# Patient Record
Sex: Female | Born: 2000 | Race: White | Hispanic: No | Marital: Single | Smoking: Former smoker
Health system: Southern US, Community
[De-identification: ages and names within clinical notes are randomized; demographics above are authoritative.]

---
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
## 2019-01-03 ENCOUNTER — Telehealth: Payer: Self-pay | Admitting: *Deleted

## 2019-01-03 DIAGNOSIS — F1994 Other psychoactive substance use, unspecified with psychoactive substance-induced mood disorder: Secondary | ICD-10-CM

## 2019-01-03 DIAGNOSIS — F1910 Other psychoactive substance abuse, uncomplicated: Secondary | ICD-10-CM

## 2019-01-03 MED ORDER — ACAMPROSATE CALCIUM 333 MG PO TBEC: 666.0000 mg | DELAYED_RELEASE_TABLET | Freq: Three times a day (TID) | ORAL | 11 refills | Status: DC

## 2019-01-03 NOTE — Telephone Encounter (Signed)
Pt left vm wanting your help with her alcohol use.  She states "i'm only 17 but it is really getting out of control".  Would you like for her to make an appointment with you first?

## 2019-01-03 NOTE — Telephone Encounter (Signed)
Tried calling pt back but her vm is full.  Will try again Monday morning.

## 2019-01-03 NOTE — Telephone Encounter (Signed)
Yes, we can do that, there are some medications that I would like her to take to decrease her urges, adding acamprosate, I am also going to do a referral for substance abuse treatment, and the office visit we can discuss Antabuse.

## 2019-03-13 ENCOUNTER — Ambulatory Visit: Payer: BC Managed Care – PPO | Admitting: Sports Medicine

## 2019-03-13 ENCOUNTER — Encounter: Payer: Self-pay | Admitting: Sports Medicine

## 2019-03-13 ENCOUNTER — Other Ambulatory Visit: Payer: Self-pay

## 2019-03-13 DIAGNOSIS — F329 Major depressive disorder, single episode, unspecified: Secondary | ICD-10-CM | POA: Diagnosis not present

## 2019-03-13 DIAGNOSIS — F419 Anxiety disorder, unspecified: Secondary | ICD-10-CM | POA: Diagnosis not present

## 2019-03-13 DIAGNOSIS — F32A Depression, unspecified: Secondary | ICD-10-CM

## 2019-03-13 MED ORDER — FLUOXETINE HCL 10 MG PO CAPS: 10.0000 mg | ORAL_CAPSULE | Freq: Every day | ORAL | 3 refills | Status: DC
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
## 2019-03-20 ENCOUNTER — Other Ambulatory Visit: Payer: Self-pay

## 2019-03-20 ENCOUNTER — Encounter: Payer: Self-pay | Admitting: Sports Medicine

## 2019-03-20 ENCOUNTER — Ambulatory Visit: Payer: BC Managed Care – PPO | Admitting: Sports Medicine

## 2019-03-20 VITALS — BP 99/64 | HR 91 | Wt 113.0 lb

## 2019-03-20 DIAGNOSIS — F419 Anxiety disorder, unspecified: Secondary | ICD-10-CM | POA: Diagnosis not present

## 2019-03-20 DIAGNOSIS — F329 Major depressive disorder, single episode, unspecified: Secondary | ICD-10-CM | POA: Diagnosis not present

## 2019-03-20 DIAGNOSIS — F32A Depression, unspecified: Secondary | ICD-10-CM

## 2019-03-20 DIAGNOSIS — Z23 Encounter for immunization: Secondary | ICD-10-CM | POA: Diagnosis not present
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
## 2019-03-27 ENCOUNTER — Ambulatory Visit: Payer: BC Managed Care – PPO | Admitting: Sports Medicine

## 2019-03-27 ENCOUNTER — Other Ambulatory Visit: Payer: Self-pay

## 2019-03-27 ENCOUNTER — Encounter: Payer: Self-pay | Admitting: Sports Medicine

## 2019-03-27 DIAGNOSIS — F419 Anxiety disorder, unspecified: Secondary | ICD-10-CM

## 2019-03-27 DIAGNOSIS — F329 Major depressive disorder, single episode, unspecified: Secondary | ICD-10-CM

## 2019-03-27 DIAGNOSIS — F32A Depression, unspecified: Secondary | ICD-10-CM

## 2019-03-27 MED ORDER — FLUOXETINE HCL 20 MG PO CAPS: 20.0000 mg | ORAL_CAPSULE | Freq: Every day | ORAL | 3 refills | Status: DC
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
## 2019-04-10 ENCOUNTER — Ambulatory Visit: Payer: BC Managed Care – PPO | Admitting: Sports Medicine

## 2019-05-09 ENCOUNTER — Telehealth (HOSPITAL_COMMUNITY): Payer: Self-pay | Admitting: Psychiatry

## 2019-05-09 NOTE — Telephone Encounter (Signed)
D:  Placed call to patient to orient her to [facility] and provide her with a start date, but there was no answer and vm was full.  A:  Will attempt to call pt later.

## 2019-05-12 ENCOUNTER — Telehealth (HOSPITAL_COMMUNITY): Payer: Self-pay | Admitting: Psychiatry

## 2019-06-16 ENCOUNTER — Emergency Department (INDEPENDENT_AMBULATORY_CARE_PROVIDER_SITE_OTHER)
Admission: EM | Admit: 2019-06-16 | Discharge: 2019-06-16 | Disposition: A | Discharge status: Home / Self Care | Payer: BC Managed Care – PPO | Source: Home / Self Care

## 2019-06-16 ENCOUNTER — Other Ambulatory Visit: Payer: Self-pay

## 2019-06-16 ENCOUNTER — Encounter: Payer: Self-pay | Admitting: Emergency Medicine

## 2019-06-16 DIAGNOSIS — B349 Viral infection, unspecified: Secondary | ICD-10-CM

## 2019-06-16 DIAGNOSIS — R05 Cough: Secondary | ICD-10-CM | POA: Diagnosis not present

## 2019-06-16 DIAGNOSIS — R059 Cough, unspecified: Secondary | ICD-10-CM

## 2019-06-16 NOTE — ED Triage Notes (Signed)
Pt states she had cold sxs x4 days. She has also lost her sense of smell and wants a covid test.

## 2019-06-16 NOTE — Discharge Instructions (Addendum)
Your symptoms are certainly consistent with COVID-19.  We are running a test that should be back in 2 to 3 days.  We will contact you if it is positive.  You can check my chart to see when the results will be back.  In the meantime we want to boost your immune system with the following: Vitamin C 500 mg twice a day Vitamin D 125 mcg or 5000 international units daily Zinc 50 mg daily Listerine mouthwash several times a day.

## 2019-06-16 NOTE — ED Provider Notes (Signed)
[facility]    CSN: [id number] Arrival date & time: 06/16/19  1413      History   Chief Complaint Chief Complaint  Patient presents with  . loss of smell    HPI Sonya Villegas is a 18 y.o. female.   Sonya Villegas urgent care patient   Pt states she had cold sxs x4 days. She has also lost her sense of smell and wants a covid test.  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  She last worked 4 days ago and developed her symptoms of cough on Friday.  She then felt normal Saturday Sunday and today.  Nevertheless, she lost her sense of taste and smell on Saturday.       History reviewed. No pertinent past medical history.  Patient Active Problem List   Diagnosis Date Noted  . Injury of left index finger 11/11/2018  . Vaginal discharge 11/04/2018  . Contraception management 08/03/2017  . Polysubstance abuse (alcohol, marijuana) with resultant mood disorder 07/24/2017  . Anxiety and depression 04/04/2017  . Annual physical exam 12/04/2016  . Numbness and tingling of left leg 12/04/2016  . Lateral dislocation of left patella 12/14/2014    History reviewed. No pertinent surgical history.  OB History   No obstetric history on file.      Home Medications    Prior to Admission medications   Medication Sig Start Date End Date Taking? Authorizing Provider  etonogestrel (NEXPLANON) 68 MG IMPL implant 1 each by Subdermal route once. 08/08/17   [provider]  FLUoxetine (PROZAC) 20 MG capsule Take 1 capsule (20 mg total) by mouth daily. 03/27/19   Monica Becton, MD    Family History History reviewed. No pertinent family history.  Social History Social History   Tobacco Use  . Smoking status: Former Smoker    Types: E-cigarettes  . Smokeless tobacco: Never Used  . Tobacco comment: Pt vapes  Substance Use Topics  . Alcohol use: No    Alcohol/week: 0.0 standard drinks  . Drug use: Yes    Types: Marijuana    Comment: occassional      Allergies   Penicillins and Amoxicillin   Review of Systems Review of Systems   Physical Exam Triage Vital Signs ED Triage Vitals  Enc Vitals Group     BP 06/16/19 1434 109/85     Pulse Rate 06/16/19 1434 90     Resp --      Temp 06/16/19 1434 98.3 F (36.8 C)     Temp Source 06/16/19 1434 Oral     SpO2 06/16/19 1434 98 %     Weight 06/16/19 1435 115 lb (52.2 kg)     Height --      Head Circumference --      Peak Flow --      Pain Score 06/16/19 1435 0     Pain Loc --      Pain Edu? --      Excl. in GC? --    No data found.  Updated Vital Signs BP 109/85 (BP Location: Right Arm)   Pulse 90   Temp 98.3 F (36.8 C) (Oral)   Wt 52.2 kg   SpO2 98%   BMI 19.74 kg/m    Physical Exam Vitals and nursing note reviewed.  Constitutional:      Appearance: Normal appearance.  Eyes:     Conjunctiva/sclera: Conjunctivae normal.  Cardiovascular:     Rate and Rhythm: Normal rate.  Pulses: Normal pulses.  Pulmonary:     Effort: Pulmonary effort is normal.     Breath sounds: Normal breath sounds.  Musculoskeletal:        General: Normal range of motion.     Cervical back: Normal range of motion and neck supple.  Skin:    General: Skin is warm.  Neurological:     General: No focal deficit present.     Mental Status: She is alert and oriented to person, place, and time.  Psychiatric:        Mood and Affect: Mood normal.        Behavior: Behavior normal.      UC Treatments / Results  Labs (all labs ordered are listed, but only abnormal results are displayed) Labs Reviewed  NOVEL CORONAVIRUS, NAA    EKG   Radiology No results found.  Procedures Procedures (including critical care time)  Medications Ordered in UC Medications - No data to display  Initial Impression / Assessment and Plan / UC Course  I have reviewed the triage vital signs and the nursing notes.  Pertinent labs & imaging results that were available during my care of the  patient were reviewed by me and considered in my medical decision making (see chart for details).    Final Clinical Impressions(s) / UC Diagnoses   Final diagnoses:  Viral syndrome     Discharge Instructions     Your symptoms are certainly consistent with COVID-19.  We are running a test that should be back in 2 to 3 days.  We will contact you if it is positive.  You can check my chart to see when the results will be back.  In the meantime we want to boost your immune system with the following: Vitamin C 500 mg twice a day Vitamin D 125 mcg or 5000 international units daily Zinc 50 mg daily Listerine mouthwash several times a day.    ED Prescriptions    None     I have reviewed the PDMP during this encounter.   Robyn Haber, MD 06/16/19 1539

## 2019-06-18 ENCOUNTER — Telehealth: Payer: Self-pay | Admitting: Emergency Medicine

## 2019-06-18 LAB — NOVEL CORONAVIRUS, NAA: SARS-CoV-2, NAA: DETECTED — AB

## 2019-06-18 NOTE — Telephone Encounter (Signed)
Your test for COVID-19 was positive, meaning that you were infected with the novel coronavirus and could give the germ to others.  Please continue isolation at home for at least 10 days since the start of your symptoms. If you do not have symptoms, please isolate at home for 10 days from the day you were tested. Once you complete your 10 day quarantine, you may return to normal activities as long as you've not had a fever for over 24 hours(without taking fever reducing medicine) and your symptoms are improving. Please continue good preventive care measures, including:  frequent hand-washing, avoid touching your face, cover coughs/sneezes, stay out of crowds and keep a 6 foot distance from others.  Go to the nearest hospital emergency room if fever/cough/breathlessness are severe or illness seems like a threat to life.  Patient contacted by phone and made aware of    results. Pt verbalized understanding. She had no questions.   

## 2019-09-26 IMAGING — DX LEFT INDEX FINGER 2+V
3 series · 3 of 3 positions shown · non-contrast
Comparison: None.

CLINICAL DATA: Pain

EXAM:
LEFT INDEX FINGER 2+V

[finger ap]
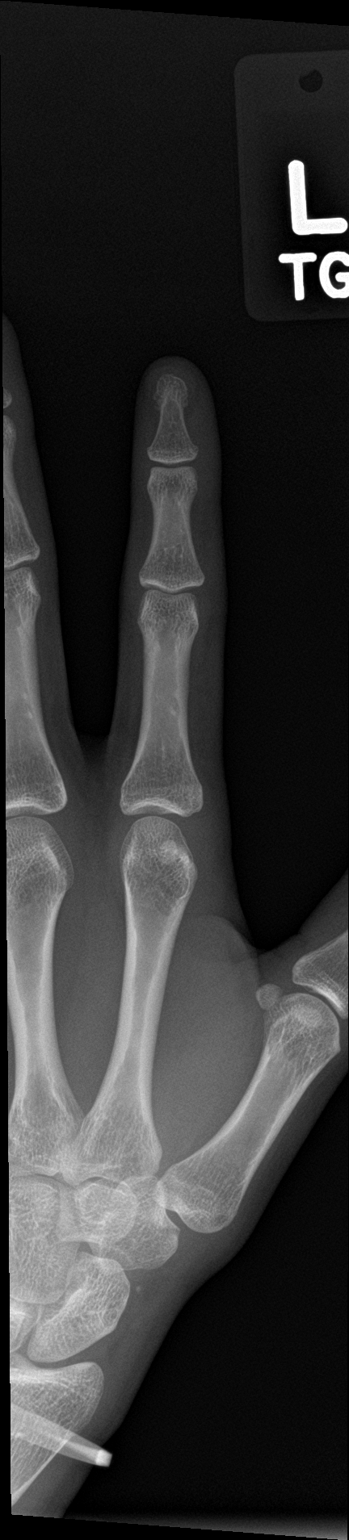

[finger obl]
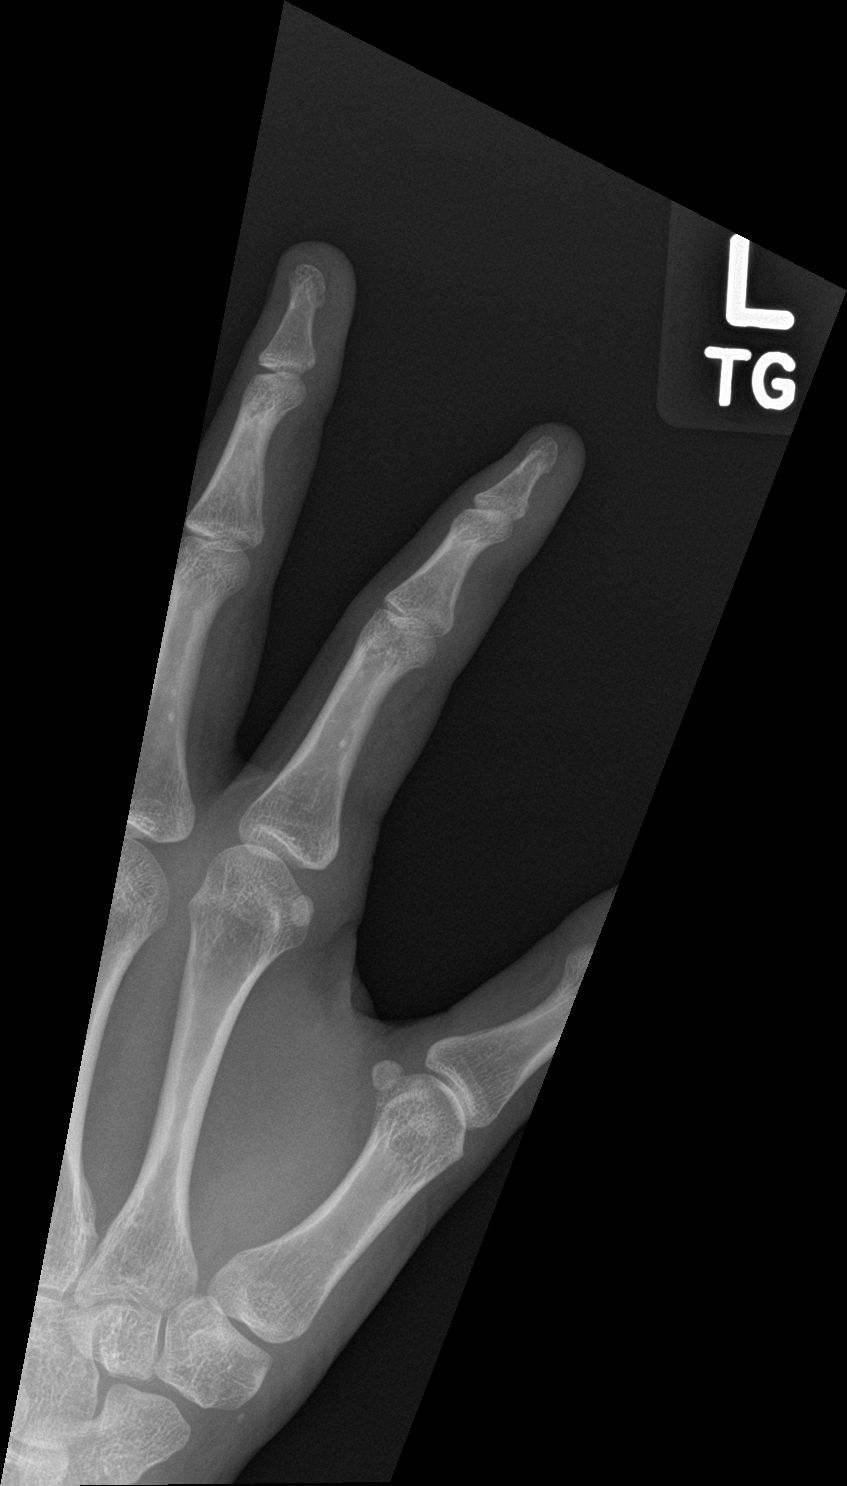

[finger lat]
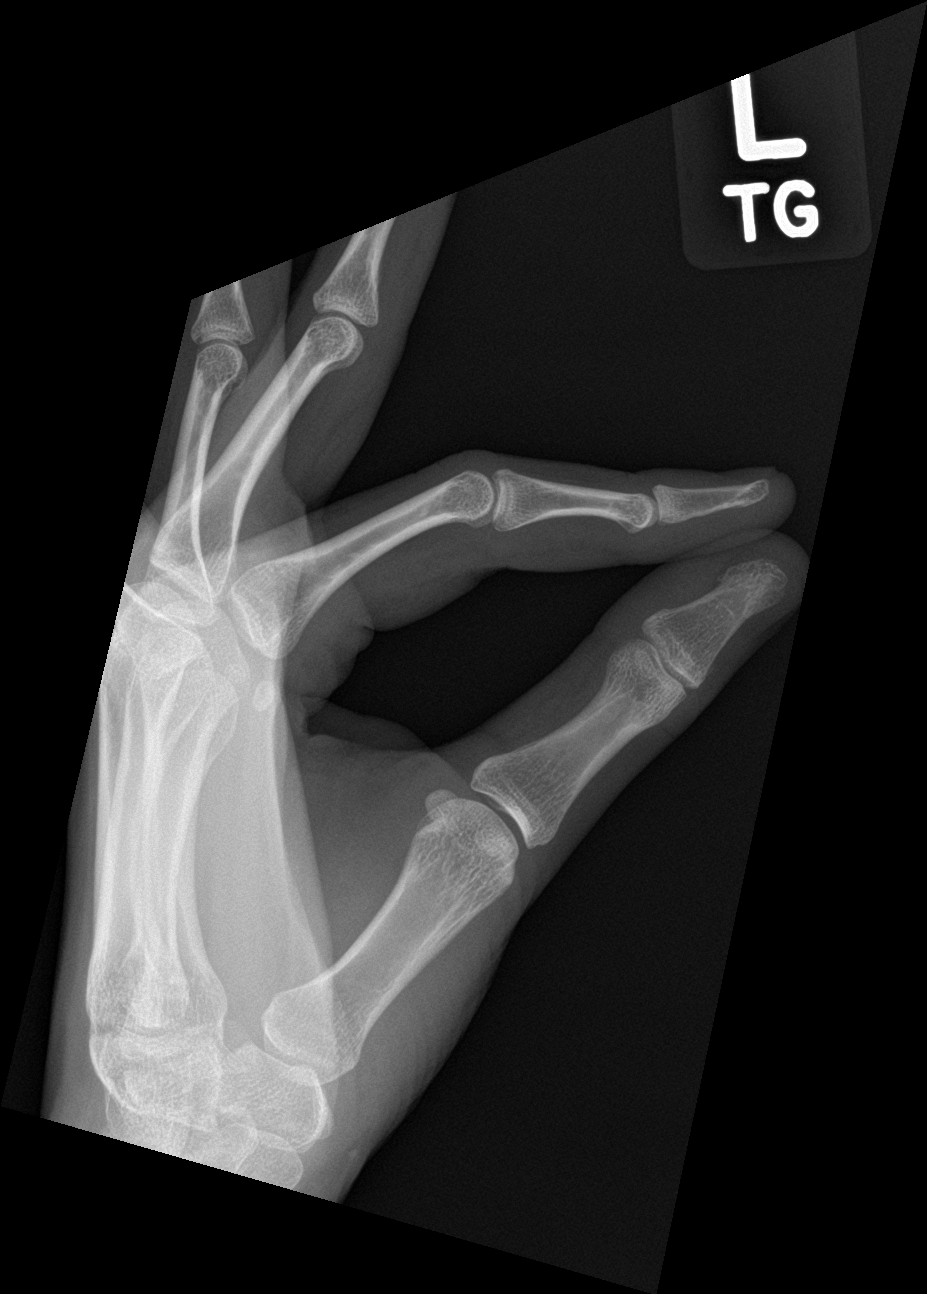

[3 of 3 positions shown; findings below may reference images not displayed]

FINDINGS: No acute fracture. No dislocation.  Unremarkable soft tissues.
IMPRESSION: No acute bony pathology.

## 2019-12-03 ENCOUNTER — Encounter: Payer: Self-pay | Admitting: Family Medicine

## 2019-12-03 ENCOUNTER — Telehealth (INDEPENDENT_AMBULATORY_CARE_PROVIDER_SITE_OTHER): Payer: BC Managed Care – PPO | Admitting: Family Medicine

## 2019-12-03 DIAGNOSIS — J029 Acute pharyngitis, unspecified: Secondary | ICD-10-CM | POA: Insufficient documentation

## 2019-12-03 MED ORDER — AZITHROMYCIN 250 MG PO TABS: ORAL_TABLET | ORAL | 0 refills | Status: DC

## 2019-12-03 NOTE — Progress Notes (Signed)
Thinks lymph nodes are swollen. Pain up in to her ears. Her nose and chest are congested.   Had strep often as a child. Feels similar.   Took Nyquil and Advil

## 2019-12-03 NOTE — Progress Notes (Signed)
Sonya Villegas - 19 y.o. female MRN [id number]  Date of birth: 2001-04-27   This visit type was conducted due to national recommendations for restrictions regarding the COVID-19 Pandemic (e.g. social distancing).  This format is felt to be most appropriate for this patient at this time.  All issues noted in this document were discussed and addressed.  No physical exam was performed (except for noted visual exam findings with Video Visits).  I discussed the limitations of evaluation and management by telemedicine and the availability of in person appointments. The patient expressed understanding and agreed to proceed.  I connected with@ on 12/03/19 at  2:40 PM EDT by a video enabled telemedicine application and verified that I am speaking with the correct person using two identifiers.  Present at visit: Sonya Coombe, DO Sonya Villegas   Patient Location: Home [address] [place] [address]   Provider location:   [facility]  No chief complaint on file.   HPI  Sonya Villegas is a 19 y.o. female who presents via audio/video conferencing for a telehealth visit today.  She has complaint of sore throat, subjective fever and swollen lymph nodes.  Has tonsil/throat swelling.  She has had some mild congestion as well.  She denies body aches, wheezing, shortness of breath, palpitations, headache, nausea, vomiting, diarrhea.  She had lab proven COVID In December.     ROS:  A comprehensive ROS was completed and negative except as noted per HPI  No past medical history on file.  No past surgical history on file.  No family history on file.  Social History   Socioeconomic History  . Marital status: Single    Spouse name: Not on file  . Number of children: Not on file  . Years of education: Not on file  . Highest education level: Not on file  Occupational History  . Not on file  Tobacco Use  . Smoking status: Former Smoker    Types: E-cigarettes  . Smokeless tobacco: Never Used  . Tobacco  comment: Pt vapes  Substance and Sexual Activity  . Alcohol use: No    Alcohol/week: 0.0 standard drinks  . Drug use: Yes    Types: Marijuana    Comment: occassional  . Sexual activity: Not on file  Other Topics Concern  . Not on file  Social History Narrative  . Not on file   Social Determinants of Health   Financial Resource Strain:   . Difficulty of Paying Living Expenses:   Food Insecurity:   . Worried About Programme researcher, broadcasting/film/video in the Last Year:   . Barista in the Last Year:   Transportation Needs:   . Freight forwarder (Medical):   Marland Kitchen Lack of Transportation (Non-Medical):   Physical Activity:   . Days of Exercise per Week:   . Minutes of Exercise per Session:   Stress:   . Feeling of Stress :   Social Connections:   . Frequency of Communication with Friends and Family:   . Frequency of Social Gatherings with Friends and Family:   . Attends Religious Services:   . Active Member of Clubs or Organizations:   . Attends Banker Meetings:   Marland Kitchen Marital Status:   Intimate Partner Violence:   . Fear of Current or Ex-Partner:   . Emotionally Abused:   Marland Kitchen Physically Abused:   . Sexually Abused:      Current Outpatient Medications:  .  etonogestrel (NEXPLANON) 68 MG  IMPL implant, 1 each by Subdermal route once., Disp: , Rfl:  .  azithromycin (ZITHROMAX) 250 MG tablet, Take 2 tabs on day 1 followed by 1 tab on days 2-5, Disp: 6 tablet, Rfl: 0 .  FLUoxetine (PROZAC) 20 MG capsule, Take 1 capsule (20 mg total) by mouth daily. (Patient not taking: Reported on 12/03/2019), Disp: 30 capsule, Rfl: 3  EXAM:  VITALS per patient if applicable: Wt 105 lb (47.6 kg)   BMI 18.02 kg/m   GENERAL: alert, oriented, appears well and in no acute distress  HEENT: atraumatic, conjunttiva clear, no obvious abnormalities on inspection of external nose and ears  NECK: normal movements of the head and neck  LUNGS: on inspection no signs of respiratory distress,  breathing rate appears normal, no obvious gross SOB, gasping or wheezing  CV: no obvious cyanosis  MS: moves all visible extremities without noticeable abnormality  PSYCH/NEURO: pleasant and cooperative, no obvious depression or anxiety, speech and thought processing grossly intact  ASSESSMENT AND PLAN:  Discussed the following assessment and plan:  Pharyngitis Similar to previous strep episodes.  Centor score of 3 Will cover empirically for strep pharyngitis with azithromycin as she is PCN allergic.   She has had COVID previously but discussed with her that after 3-4 months there is no guarantee that natural immunity is still protective.   We discussed if she has worsening symptoms I would recommend COVID testing.   Recommend warm salt water gargles for comfort and stay well hydrated.   30 minutes spent including pre visit preparation, review of prior notes and labs, encounter with patient via video visit and same day documentation.    I discussed the assessment and treatment plan with the patient. The patient was provided an opportunity to ask questions and all were answered. The patient agreed with the plan and demonstrated an understanding of the instructions.   The patient was advised to call back or seek an in-person evaluation if the symptoms worsen or if the condition fails to improve as anticipated.    Luetta Nutting, DO

## 2019-12-03 NOTE — Assessment & Plan Note (Addendum)
Similar to previous strep episodes.  Centor score of 3 Will cover empirically for strep pharyngitis with azithromycin as she is PCN allergic.   She has had COVID previously but discussed with her that after 3-4 months there is no guarantee that natural immunity is still protective.   We discussed if she has worsening symptoms I would recommend COVID testing.   Recommend warm salt water gargles for comfort and stay well hydrated.

## 2019-12-04 ENCOUNTER — Telehealth: Payer: Self-pay | Admitting: Sports Medicine

## 2019-12-04 NOTE — Telephone Encounter (Signed)
Discussed with her that she would need to sign up through mychart to receive electronically. Sent link yesterday.   Otherwise she can stop by and pick up.

## 2019-12-04 NOTE — Telephone Encounter (Signed)
Patient never received a work Physicist, medical through e-mail excusing her. 6/9- to however long she should be out for strep throat. Email to [email or web address]

## 2019-12-05 ENCOUNTER — Ambulatory Visit (INDEPENDENT_AMBULATORY_CARE_PROVIDER_SITE_OTHER): Payer: BC Managed Care – PPO | Admitting: Nurse Practitioner

## 2019-12-05 DIAGNOSIS — Z5329 Procedure and treatment not carried out because of patient's decision for other reasons: Secondary | ICD-10-CM

## 2019-12-05 NOTE — Telephone Encounter (Signed)
Patient advised.

## 2019-12-16 ENCOUNTER — Encounter (HOSPITAL_COMMUNITY): Payer: Self-pay | Admitting: Psychiatry

## 2019-12-16 ENCOUNTER — Telehealth (INDEPENDENT_AMBULATORY_CARE_PROVIDER_SITE_OTHER): Payer: BC Managed Care – PPO | Admitting: Psychiatry

## 2019-12-16 DIAGNOSIS — F0630 Mood disorder due to known physiological condition, unspecified: Secondary | ICD-10-CM | POA: Diagnosis not present

## 2019-12-16 DIAGNOSIS — F411 Generalized anxiety disorder: Secondary | ICD-10-CM | POA: Diagnosis not present

## 2019-12-16 DIAGNOSIS — F4010 Social phobia, unspecified: Secondary | ICD-10-CM | POA: Diagnosis not present

## 2019-12-16 MED ORDER — LAMOTRIGINE 25 MG PO TABS: 25.0000 mg | ORAL_TABLET | Freq: Every day | ORAL | 0 refills | Status: DC

## 2019-12-16 MED ORDER — BUSPIRONE HCL 7.5 MG PO TABS: 7.5000 mg | ORAL_TABLET | Freq: Every day | ORAL | 0 refills | Status: DC
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
## 2019-12-24 ENCOUNTER — Ambulatory Visit (INDEPENDENT_AMBULATORY_CARE_PROVIDER_SITE_OTHER): Payer: BC Managed Care – PPO | Admitting: Sports Medicine

## 2019-12-24 ENCOUNTER — Encounter: Payer: Self-pay | Admitting: Sports Medicine

## 2019-12-24 ENCOUNTER — Other Ambulatory Visit: Payer: Self-pay

## 2019-12-24 DIAGNOSIS — F319 Bipolar disorder, unspecified: Secondary | ICD-10-CM | POA: Diagnosis not present

## 2019-12-24 DIAGNOSIS — S060X1A Concussion with loss of consciousness of 30 minutes or less, initial encounter: Secondary | ICD-10-CM

## 2019-12-24 DIAGNOSIS — S060XAA Concussion with loss of consciousness status unknown, initial encounter: Secondary | ICD-10-CM | POA: Insufficient documentation

## 2019-12-24 NOTE — Progress Notes (Signed)
° ° °  Procedures performed today:    None.  Independent interpretation of notes and tests performed by another provider:   I reviewed notes from psychiatry downstairs.  I also reviewed notes from her hospitalization at [facility].  I have also reviewed labs done in the hospital.  Brief History, Exam, Impression, and Recommendations:    Concussion This is a pleasant 19 year old female, she is currently fighting bipolar disorder, she has lost a significant amount of weight. She was at a party, did not eat, had some alcohol, and smokes some marijuana, it sounds like she became orthostatic, fell and hit her head.  She denied any prodromal palpitations or chest pain, she also denies any postevent tongue biting or incontinence, no seizure activity was seen. She was conscious almost immediately but had hit her head on the way down.  She has since had difficulties focusing, headaches, initially some nausea, bright lights and screens tended to bother her. Symptoms have improved considerably, she feels safe to drive and that her focus has improved, but she still has mild headaches and difficulty with bright screens. 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 Neurologic exam is otherwise unremarkable. I think she should practice physical and cognitive rest with only 1 to 2 hours of screen time per day, and I would like to see her back in about 2 weeks to repeat testing.  Bipolar 1 disorder (HCC) Currently under the care of Dr. Gilmore Laroche, she is on BuSpar and Lamictal. Currently in manic phase, she has great insight regarding not acting on desires to do risky behaviors. No current suicidal/homicidal ideation.    ___________________________________________ Ihor Austin. Benjamin Stain, M.D., ABFM., CAQSM. Primary Care and Sports Medicine [place]  [facility]  Adjunct Instructor of Family Medicine  [employer]

## 2019-12-24 NOTE — Assessment & Plan Note (Signed)
Currently under the care of Dr. Gilmore Laroche, she is on BuSpar and Lamictal. Currently in manic phase, she has great insight regarding not acting on desires to do risky behaviors. No current suicidal/homicidal ideation.

## 2019-12-24 NOTE — Assessment & Plan Note (Signed)
This is a pleasant 19 year old female, she is currently fighting bipolar disorder, she has lost a significant amount of weight. She was at a party, did not eat, had some alcohol, and smokes some marijuana, it sounds like she became orthostatic, fell and hit her head.  She denied any prodromal palpitations or chest pain, she also denies any postevent tongue biting or incontinence, no seizure activity was seen. She was conscious almost immediately but had hit her head on the way down.  She has since had difficulties focusing, headaches, initially some nausea, bright lights and screens tended to bother her. Symptoms have improved considerably, she feels safe to drive and that her focus has improved, but she still has mild headaches and difficulty with bright screens. 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 Neurologic exam is otherwise unremarkable. I think she should practice physical and cognitive rest with only 1 to 2 hours of screen time per day, and I would like to see her back in about 2 weeks to repeat testing.

## 2019-12-24 NOTE — Patient Instructions (Signed)
Concussion, Adult  A concussion is a brain injury from a hard, direct hit (trauma) to the head or body. This direct hit causes the brain to shake quickly back and forth inside the skull. This can damage brain cells and cause chemical changes in the brain. A concussion may also be known as a mild traumatic brain injury (TBI). Concussions are usually not life-threatening, but the effects of a concussion can be serious. If you have a concussion, you should be very careful to avoid having a second concussion. What are the causes? This condition is caused by:  A direct hit to your head, such as: ? Running into another player during a game. ? Being hit in a fight. ? Hitting your head on a hard surface.  Sudden movement of your body that causes your brain to move back and forth inside the skull, such as in a car crash. What are the signs or symptoms? The signs of a concussion can be hard to notice. Early on, they may be missed by you, family members, and health care providers. You may look fine on the outside but may act or feel differently. Symptoms are usually temporary and most often improve in 7-10 days. Some symptoms appear right away, but other symptoms may not show up for hours or days. If your symptoms last longer than normal, you may have post-concussion syndrome. Every head injury is different. Physical symptoms  Headaches. This can include a feeling of pressure in the head or migraine-like symptoms.  Tiredness (fatigue).  Dizziness.  Problems with coordination or balance.  Vision or hearing problems.  Sensitivity to light or noise.  Nausea or vomiting.  Changes in eating or sleeping patterns.  Numbness or tingling.  Seizure. Mental and emotional symptoms  Memory problems.  Trouble concentrating, organizing, or making decisions.  Slowness in thinking, acting or reacting, speaking, or reading.  Irritability or mood changes.  Anxiety or depression. How is this  diagnosed? This condition is diagnosed based on:  Your symptoms.  A description of your injury. You may also have tests, including:  Imaging tests, such as a CT scan or MRI.  Neuropsychological tests. These measure your thinking, understanding, learning, and remembering abilities. How is this treated? Treatment for this condition includes:  Stopping sports or activity if you are injured. If you hit your head or show signs of concussion: ? Do not return to sports or activities the same day. ? Get checked by a health care provider before you return to your activities.  Physical and mental rest and careful observation, usually at home. Gradually return to your normal activities.  Medicines to help with symptoms such as headaches, nausea, or difficulty sleeping. ? Avoid taking opioid pain medicine while recovering from a concussion.  Avoiding alcohol and drugs. These may slow your recovery and can put you at risk of further injury.  Referral to a concussion clinic or rehabilitation center. Recovery from a concussion can take time. How fast you recover depends on many factors. Return to activities only when:  Your symptoms are completely gone.  Your health care provider says that it is safe. Follow these instructions at home: Activity  Limit activities that require a lot of thought or concentration, such as: ? Doing homework or job-related work. ? Watching TV. ? Working on the computer or phone. ? Playing memory games and puzzles.  Rest. Rest helps your brain heal. Make sure you: ? Get plenty of sleep. Most adults should get 7-9 hours of sleep   each night. ? Rest during the day. Take naps or rest breaks when you feel tired.  Avoid physical activity like exercise until your health care provider says it is safe. Stop any activity that worsens symptoms.  Do not do high-risk activities that could cause a second concussion, such as riding a bike or playing sports.  Ask your  health care provider when you can return to your normal activities, such as school, work, athletics, and driving. Your ability to react may be slower after a brain injury. Never do these activities if you are dizzy. Your health care provider will likely give you a plan for gradually returning to activities. General instructions   Take over-the-counter and prescription medicines only as told by your health care provider. Some medicines, such as blood thinners (anticoagulants) and aspirin, may increase the risk for complications, such as bleeding.  Do not drink alcohol until your health care provider says you can.  Watch your symptoms and tell others around you to do the same. Complications sometimes occur after a concussion. Older adults with a brain injury may have a higher risk of serious complications.  Tell your work manager, teachers, school nurse, school counselor, coach, or athletic trainer about your injury, symptoms, and restrictions.  Keep all follow-up visits as told by your health care provider. This is important. How is this prevented? Avoiding another brain injury is very important. In rare cases, another injury can lead to permanent brain damage, brain swelling, or death. The risk of this is greatest during the first 7-10 days after a head injury. Avoid injuries by:  Stopping activities that could lead to a second concussion, such as contact or recreational sports, until your health care provider says it is okay.  Taking these actions once you have returned to sports or activities: ? Avoiding plays or moves that can cause you to crash into another person. This is how most concussions occur. ? Following the rules and being respectful of other players. Do not engage in violent or illegal plays.  Getting regular exercise that includes strength and balance training.  Wearing a properly fitting helmet during sports, biking, or other activities. Helmets can help protect you from  serious skull and brain injuries, but they do not protect you from a concussion. Even when wearing a helmet, you should avoid being hit in the head. Contact a health care provider if:  Your symptoms get worse or they do not improve.  You have new symptoms.  You have another injury. Get help right away if:  You have severe or worsening headaches.  You have weakness or numbness in any part of your body.  You are confused.  Your coordination gets worse.  You vomit repeatedly.  You are sleepier than normal.  Your speech is slurred.  You cannot recognize people or places.  You have a seizure.  It is difficult to wake you up.  You have unusual behavior changes.  You have changes in your vision.  You lose consciousness. Summary  A concussion is a brain injury that results from a hard, direct hit (trauma) to your head or body.  You may have imaging tests and neuropsychological tests to diagnose a concussion.  Treatment for this condition includes physical and mental rest and careful observation.  Ask your health care provider when you can return to your normal activities, such as school, work, athletics, and driving.  Get help right away if you have a severe headache, weakness on one side of the   body, seizures, behavior changes, changes in vision, or if you are confused or sleepier than normal. This information is not intended to replace advice given to you by your health care provider. Make sure you discuss any questions you have with your health care provider. Document Revised: 01/31/2018 Document Reviewed: 01/31/2018 Elsevier Patient Education  2020 Elsevier Inc.  

## 2020-01-05 ENCOUNTER — Encounter (HOSPITAL_COMMUNITY): Payer: Self-pay | Admitting: Psychiatry

## 2020-01-05 ENCOUNTER — Telehealth (INDEPENDENT_AMBULATORY_CARE_PROVIDER_SITE_OTHER): Payer: BC Managed Care – PPO | Admitting: Psychiatry

## 2020-01-05 DIAGNOSIS — F0630 Mood disorder due to known physiological condition, unspecified: Secondary | ICD-10-CM

## 2020-01-05 DIAGNOSIS — F411 Generalized anxiety disorder: Secondary | ICD-10-CM | POA: Diagnosis not present

## 2020-01-05 DIAGNOSIS — F4010 Social phobia, unspecified: Secondary | ICD-10-CM | POA: Diagnosis not present

## 2020-01-05 MED ORDER — BUSPIRONE HCL 7.5 MG PO TABS: 7.5000 mg | ORAL_TABLET | Freq: Every day | ORAL | 0 refills | Status: AC

## 2020-01-05 MED ORDER — LAMOTRIGINE 100 MG PO TABS: 100.0000 mg | ORAL_TABLET | Freq: Every day | ORAL | 0 refills | Status: DC
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
## 2020-01-07 ENCOUNTER — Encounter: Payer: Self-pay | Admitting: Sports Medicine

## 2020-01-07 ENCOUNTER — Ambulatory Visit (INDEPENDENT_AMBULATORY_CARE_PROVIDER_SITE_OTHER): Payer: BC Managed Care – PPO | Admitting: Sports Medicine

## 2020-01-07 DIAGNOSIS — S060X1D Concussion with loss of consciousness of 30 minutes or less, subsequent encounter: Secondary | ICD-10-CM

## 2020-01-07 DIAGNOSIS — I951 Orthostatic hypotension: Secondary | ICD-10-CM | POA: Diagnosis not present

## 2020-01-07 MED ORDER — FLUDROCORTISONE ACETATE 0.1 MG PO TABS: 0.1000 mg | ORAL_TABLET | Freq: Every day | ORAL | 0 refills | Status: DC

## 2020-01-07 NOTE — Progress Notes (Signed)
    Procedures performed today:    None.  Independent interpretation of notes and tests performed by another provider:   None.  Brief History, Exam, Impression, and Recommendations:    Concussion Sonya Villegas returns, I think her concussive symptoms are resolved, she is somewhat orthostatic.  Orthostatic hypotension Sonya Villegas has also had significant episodes of dizziness and presyncope on standing, she does have abnormal orthostatic vital signs today. She feels as though she is drinking a sufficient amount of fluids, because of this we are going to check some labs, and I am going to start her on a low-dose of fludrocortisone. She also understands that her marijuana use is likely resulting in decrease in blood pressure as well. I'd like to see her back in 1 to 2 weeks to reevaluate. If insufficient improvement we will likely have to run some normal saline IV.    ___________________________________________ Ihor Austin. Benjamin Stain, M.D., ABFM., CAQSM. Primary Care and Sports Medicine [facility]  Adjunct Instructor of Family Medicine  [employer]

## 2020-01-07 NOTE — Assessment & Plan Note (Addendum)
Sonya Villegas has also had significant episodes of dizziness and presyncope on standing, she does have abnormal orthostatic vital signs today. She feels as though she is drinking a sufficient amount of fluids, because of this we are going to check some labs, and I am going to start her on a low-dose of fludrocortisone. She also understands that her marijuana use is likely resulting in decrease in blood pressure as well. I'd like to see her back in 1 to 2 weeks to reevaluate. If insufficient improvement we will likely have to run some normal saline IV.

## 2020-01-07 NOTE — Assessment & Plan Note (Signed)
Sonya Villegas returns, I think her concussive symptoms are resolved, she is somewhat orthostatic.

## 2020-01-21 ENCOUNTER — Ambulatory Visit (INDEPENDENT_AMBULATORY_CARE_PROVIDER_SITE_OTHER): Payer: BC Managed Care – PPO | Admitting: Sports Medicine

## 2020-01-21 ENCOUNTER — Encounter: Payer: Self-pay | Admitting: Sports Medicine

## 2020-01-21 DIAGNOSIS — I951 Orthostatic hypotension: Secondary | ICD-10-CM | POA: Diagnosis not present

## 2020-01-21 DIAGNOSIS — R634 Abnormal weight loss: Secondary | ICD-10-CM | POA: Diagnosis not present

## 2020-01-21 MED ORDER — MIRTAZAPINE 15 MG PO TABS: 15.0000 mg | ORAL_TABLET | Freq: Every day | ORAL | 3 refills | Status: DC
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
## 2020-01-21 NOTE — Assessment & Plan Note (Signed)
Dizziness, presyncope has now resolved with low-dose fludrocortisone. Continue this for now, I am going to help her gain some weight per her request so my suspicion is we will eventually be able to discontinue this medication.

## 2020-02-10 ENCOUNTER — Telehealth (HOSPITAL_COMMUNITY): Payer: BC Managed Care – PPO | Admitting: Psychiatry

## 2020-02-18 ENCOUNTER — Ambulatory Visit: Payer: BC Managed Care – PPO | Admitting: Sports Medicine

## 2020-04-05 ENCOUNTER — Encounter: Payer: Self-pay | Admitting: Osteopathic Medicine

## 2020-04-05 ENCOUNTER — Ambulatory Visit: Payer: BC Managed Care – PPO | Admitting: Osteopathic Medicine

## 2020-04-05 ENCOUNTER — Other Ambulatory Visit: Payer: Self-pay

## 2020-04-05 VITALS — BP 99/66 | HR 74 | Temp 98.0°F | Wt 115.0 lb

## 2020-04-05 DIAGNOSIS — Z3046 Encounter for surveillance of implantable subdermal contraceptive: Secondary | ICD-10-CM

## 2020-04-05 MED ORDER — NORETHIN ACE-ETH ESTRAD-FE 1-20 MG-MCG PO TABS
1.0000 | ORAL_TABLET | Freq: Every day | ORAL | 4 refills | Status: DC
Start: 2020-04-05 — End: 2021-08-08

## 2020-04-05 NOTE — Progress Notes (Signed)
Nexplanon Removal Procedure Note PRE-OP DIAGNOSIS: Nexplanon, desire for change of contraception  POST-OP DIAGNOSIS: Same  PROCEDURE: Nexplanon Removal  Performing Physician: Lyn Hollingshead PROCEDURE:  Anesthesia: 2% Lidocaine w/ epinephrine 5 ml  Procedure: Consent obtained. A time-out was performed prior to initiating procedure to be sure of right patient and right location. The area surrounding the Nexplanon was prepared in the usual sterile manner. The site was anesthetized with lidocaine. A skin incision was made over the distal aspect of the device. The capsule lysed sharply and the device removed using a hemostat. Hemostasis was assured. The site was dressed with SteriStrips. The patient tolerated the procedure well.  Followup: The patient tolerated the procedure well without complications. Standard post-procedure care is explained and return precautions are given. Contraception is advised until conception is desired. Rx for [facility] sent.

## 2020-04-27 ENCOUNTER — Other Ambulatory Visit: Payer: Self-pay

## 2020-04-27 ENCOUNTER — Ambulatory Visit (INDEPENDENT_AMBULATORY_CARE_PROVIDER_SITE_OTHER): Payer: BC Managed Care – PPO | Admitting: Nurse Practitioner

## 2020-04-27 ENCOUNTER — Encounter: Payer: Self-pay | Admitting: Nurse Practitioner

## 2020-04-27 ENCOUNTER — Ambulatory Visit: Payer: BC Managed Care – PPO

## 2020-04-27 VITALS — BP 124/86 | HR 98 | Temp 98.4°F | Ht 64.0 in | Wt 116.1 lb

## 2020-04-27 DIAGNOSIS — R1032 Left lower quadrant pain: Secondary | ICD-10-CM

## 2020-04-27 DIAGNOSIS — R109 Unspecified abdominal pain: Secondary | ICD-10-CM | POA: Diagnosis not present

## 2020-04-27 DIAGNOSIS — N3001 Acute cystitis with hematuria: Secondary | ICD-10-CM | POA: Diagnosis not present

## 2020-04-27 MED ORDER — CIPROFLOXACIN HCL 500 MG PO TABS: 500.0000 mg | ORAL_TABLET | Freq: Every day | ORAL | 0 refills | Status: DC

## 2020-04-27 MED ORDER — TRAMADOL HCL 50 MG PO TABS: 50.0000 mg | ORAL_TABLET | Freq: Three times a day (TID) | ORAL | 0 refills | Status: AC | PRN

## 2020-04-27 NOTE — Patient Instructions (Addendum)
The medication in Plan B, levonorgestrel, can cause abdominal cramping and changes in your period. These are usually short term and do not last long. There are other things that can also cause these symptoms, so we will plan to test for these today to rule them out.   Blood test today to rule out pregnancy.   Vaginal swab for trichomoniasis, yeast, and bacterial vaginosis  Urine for chlamydia and gonorrhea   Your next period may be about a week Sonya Villegas or a week late and may be lighter or heavier than usual. You also may experience spotting in between your period. These are common after taking Plan B.   This medication can decrease the levels of Lamictal in your blood while you are taking it. Do not use on a regular basis to prevent pregnancy as this can change the way the Lamictal is working for you.   It is very important to note that Plan B prevents you from ovulating, but is not guaranteed to prevent pregnancy. If you have already begun to ovulate when you have unprotected sex, the medication does not work and you can still get pregnant. It is Vallery to rely on a birth control method that is more effective to help protect against pregnancy.   Always remember to protect yourself against STD's, too. Wearing a condom with every sexual encounter is the most safe way to protect against STD's if you are having sex.  Here is some other information on the Plan B medication for you to look over and have. Levonorgestrel emergency contraceptive kit What is this medicine? LEVONORGESTREL (LEE voe nor JES trel) is an emergency contraceptive. It prevents pregnancy if taken within 72 hours after your regular birth control fails or you have unprotected sex. This medicine will not work if you are already pregnant. This medicine may be used for other purposes; ask your health care provider or pharmacist if you have questions. COMMON BRAND NAME(S): [place] EZ, EContra One-Step, Fallback Solo, My Choice, My Way,  Next Choice, Next Choice One Dose, Opcicon One-Step, Plan B, Plan B One-Step, Preventeza, React, Take Action What should I tell my health care provider before I take this medicine? They need to know if you have or ever had any of these conditions:  an unusual or allergic reaction to levonorgestrel, other medicines, foods, dyes, or preservatives  pregnant or trying to get pregnant  breast-feeding How should I use this medicine? Take this medicine by mouth. Your doctor may want you to use a quick-response pregnancy test prior to using the tablets. Take your medicine as soon as you can after having unprotected sex, preferably in the first 24 hours, but no later than 72 hours (3 days) after the event. Follow the dose instructions of your health care provider exactly. Do not take any extra pills. Extra pills will not decrease your risk of pregnancy, but may increase your risk of side effects. A patient package insert for the product will be given with each prescription and refill. Read this sheet carefully each time. The sheet may change frequently. Contact your pediatrician regarding the use of this medicine in children. Special care may be needed. This medicine has been used in female children who have started having menstrual periods. Overdosage: If you think you have taken too much of this medicine contact a poison control center or emergency room at once. NOTE: This medicine is only for you. Do not share this medicine with others. What if I miss a dose? This  medicine is not for regular use. Take exactly as directed. If you vomit within 2 hours of taking your dose, contact your health care professional for instructions. What may interact with this medicine?  aprepitant  armodafinil  barbiturates such as phenobarbital or primidone  bexarotene  bosentan  carbamazepine  certain medicines for HIV or AIDS or  hepatitis  felbamate  griseofulvin  modafinil  oxcarbazepine  phenytoin  rifabutin  rifampin  rifapentine  St. John's wort  topiramate This list may not describe all possible interactions. Give your health care provider a list of all the medicines, herbs, non-prescription drugs, or dietary supplements you use. Also tell them if you smoke, drink alcohol, or use illegal drugs. Some items may interact with your medicine. What should I watch for while using this medicine? Your period may begin a few days earlier or later than expected. If your period is more than 7 days late, pregnancy is possible. See your health care provider as soon as you can and get a pregnancy test. Talk to your healthcare provider before taking this medicine if you know or suspect that you are pregnant. Contact your healthcare provider if you think you may be pregnant and you have taken this medicine. If you have severe abdominal pain, you may have a pregnancy outside the womb, which is called an ectopic or tubal pregnancy. Call your health care provider or go to the nearest emergency room right away if you think this is happening. Discuss birth control options with your health care provider. Emergency birth control is not to be used routinely to prevent pregnancy. Be sure to use your regular birth control method right away, or start one, if you do not have a regular birth control method already. This medicine does not protect you against HIV infection (AIDS) or any other sexually transmitted diseases (STDs). What side effects may I notice from receiving this medicine? Side effects that you should report to your doctor or health care professional as soon as possible:  allergic reactions like skin rash, itching or hives, swelling of the face, lips, or tongue Side effects that usually do not require medical attention (report to your doctor or health care professional if they continue or are bothersome):  abdominal  pain or cramping  breast tenderness  dizziness  headache  nausea  spotting  tiredness This list may not describe all possible side effects. Call your doctor for medical advice about side effects. You may report side effects to FDA at 1-800-FDA-1088. Where should I keep my medicine? Keep out of the reach of children. Store at room temperature between 15 and 30 degrees C (59 and 86 degrees F). Throw away any unused medicine after the expiration date. NOTE: This sheet is a summary. It may not cover all possible information. If you have questions about this medicine, talk to your doctor, pharmacist, or health care provider.  2020 Elsevier/Gold Standard (2016-10-27 14:30:49)

## 2020-04-27 NOTE — Assessment & Plan Note (Addendum)
Diffuse lower abdominal pain with significant tenderness noted in the left lower quadrant and suprapubic region.  At this time etiology of her significant symptoms is unclear. Concerns today include the possibility of ectopic pregnancy vs. complicated UTI vs. STI.  Prescription for 3 days of Tramadol provided for severe pain.  UA in office today is positive for the presents of leukocytes and trace amounts of lysed blood. Will send urine for culture for further evaluation. Will plan to begin treatment today with ciprofloxacin x 3 days for UTI.  Wet prep also performed today for evaluation due to pelvic pain and changes in vaginal discharge.  HCG quantitative blood testing ordered to rule out pregnancy. Transvaginal and abdominal ultrasound ordered to evaluate for possible ectopic pregnancy or other etiology for symptoms.  Urine GC/Chlamydia performed today to rule out STI.  Discussed emergency symptoms with patient that would warrant immediate evaluation in the emergency room such as worsening/increasing pain, fevers, inability to hold down liquids, dizziness, or feelings of impending doom.  Will make changes to the plan of care based on results of laboratory and imaging evaluation.  Discussed contraceptive management with patient and mechanism of action of Plan B as emergency contraception as well as common side effects.  Plan to follow-up with patient when lab results return. Patient encouraged to contact me with any questions or concerns in the meantime.

## 2020-04-27 NOTE — Progress Notes (Signed)
Acute Office Visit  Subjective:    Patient ID: MARQUASHA BRUTUS, female    DOB: February 27, 2001, 19 y.o.   MRN: [id number]  Chief Complaint  Patient presents with  . Abdominal Pain    LLQ    HPI Judit Awad is a pleasant 19 year old female presenting today for concerns with left lower quadrant abdominal pain for the past 4 days after taking Plan B (leovorgestrel) as emergency contraception.   She endorses constant lower abdominal tenderness with intermittent sharp, cramping pains. She reports the pain started shortly after taking the Plan B and has continued since that time. She has been taking ibuprofen throughout the day, which has been helpful for her symptoms, but she is waking up in the night with the cramping pain.   She did have a nexplanon implant removed approximately one month ago and was started on combination OCP's. She reports that she has not missed any doses of her OCP, but has not taken them at the exact same time every day.   She tells me that she experienced changes in her vaginal discharge and mood changes which prompted her to take the Plan B as emergency contraception. She reports that she took the emergency contraception approximately 3-4 days after intercourse.   Her LMP was approximately 2 weeks ago. She tells me that she does not have a history of regular menstrual periods and she does experience mild cramping during menses, however, reports that the current cramps she is experiencing are significantly worse.   She reports taking the Plan B on Friday 10/29 as a single dose of medication. She did not experience any vomiting after taking the medication.   She does have a history of frequent UTI's, but she reports that she is usually able to manage these with AZO and reports her symptoms subside. She has not been on antibiotics recently for UTI nor has she been treated for one recently. She does tell me her current symptoms are not consistent with her usual UTI symptoms.   She  denies headache, vomiting, dizziness, breast pain, dysmenorrhea, vaginal spotting, vaginal odor, fever, chills, or diarrhea.   She does endorse nausea, fatigue, severe abdominal cramping, clear/watery discharge ("not normal"), some urinary frequency and small volume voiding, low back pain, and bladder spasms.    History reviewed. No pertinent past medical history.  History reviewed. No pertinent surgical history.  History reviewed. No pertinent family history.  Social History   Socioeconomic History  . Marital status: Single    Spouse name: Not on file  . Number of children: Not on file  . Years of education: Not on file  . Highest education level: Not on file  Occupational History  . Not on file  Tobacco Use  . Smoking status: Former Smoker    Types: E-cigarettes  . Smokeless tobacco: Never Used  . Tobacco comment: Pt vapes  Vaping Use  . Vaping Use: Every day  Substance and Sexual Activity  . Alcohol use: No    Alcohol/week: 0.0 standard drinks  . Drug use: Yes    Types: Marijuana    Comment: occassional  . Sexual activity: Not on file  Other Topics Concern  . Not on file  Social History Narrative  . Not on file   Social Determinants of Health   Financial Resource Strain:   . Difficulty of Paying Living Expenses: Not on file  Food Insecurity:   . Worried About Programme researcher, broadcasting/film/video in the Last Year: Not on  file  . Ran Out of Food in the Last Year: Not on file  Transportation Needs:   . Lack of Transportation (Medical): Not on file  . Lack of Transportation (Non-Medical): Not on file  Physical Activity:   . Days of Exercise per Week: Not on file  . Minutes of Exercise per Session: Not on file  Stress:   . Feeling of Stress : Not on file  Social Connections:   . Frequency of Communication with Friends and Family: Not on file  . Frequency of Social Gatherings with Friends and Family: Not on file  . Attends Religious Services: Not on file  . Active Member of  Clubs or Organizations: Not on file  . Attends Banker Meetings: Not on file  . Marital Status: Not on file  Intimate Partner Violence:   . Fear of Current or Ex-Partner: Not on file  . Emotionally Abused: Not on file  . Physically Abused: Not on file  . Sexually Abused: Not on file    Outpatient Medications Prior to Visit  Medication Sig Dispense Refill  . busPIRone (BUSPAR) 7.5 MG tablet Take 1 tablet (7.5 mg total) by mouth daily. 30 tablet 0  . fludrocortisone (FLORINEF) 0.1 MG tablet Take 1 tablet (0.1 mg total) by mouth daily. 30 tablet 0  . norethindrone-ethinyl estradiol (JUNEL FE 1/20) 1-20 MG-MCG tablet Take 1 tablet by mouth daily. 84 tablet 4  . lamoTRIgine (LAMICTAL) 100 MG tablet Take 1 tablet (100 mg total) by mouth daily. Take one tablet daily for a week and then start taking 2 tablets. (Patient not taking: Reported on 04/27/2020) 30 tablet 0  . mirtazapine (REMERON) 15 MG tablet Take 1 tablet (15 mg total) by mouth at bedtime. (Patient not taking: Reported on 04/27/2020) 30 tablet 3   No facility-administered medications prior to visit.    Allergies  Allergen Reactions  . Penicillins Anaphylaxis  . Amoxicillin Rash    Review of Systems All review of systems negative except what is listed in the HPI     Objective:    Physical Exam Vitals and nursing note reviewed.  Constitutional:      Appearance: She is well-developed and normal weight.  HENT:     Head: Normocephalic.  Eyes:     Extraocular Movements: Extraocular movements intact.     Pupils: Pupils are equal, round, and reactive to light.  Cardiovascular:     Rate and Rhythm: Normal rate and regular rhythm.     Heart sounds: Normal heart sounds.  Pulmonary:     Effort: Pulmonary effort is normal.     Breath sounds: Normal breath sounds.  Abdominal:     General: Abdomen is flat. Bowel sounds are decreased. There is no distension or abdominal bruit.     Palpations: Abdomen is soft. There is  no shifting dullness, fluid wave, hepatomegaly, splenomegaly, mass or pulsatile mass.     Tenderness: There is abdominal tenderness in the right lower quadrant, suprapubic area and left lower quadrant. There is guarding. There is no right CVA tenderness, left CVA tenderness or rebound. Negative signs include Murphy's sign, Rovsing's sign and McBurney's sign.     Hernia: No hernia is present.    Genitourinary:    Comments: Self swab performed Skin:    General: Skin is warm and dry.     Capillary Refill: Capillary refill takes less than 2 seconds.  Neurological:     General: No focal deficit present.     Mental Status:  She is alert and oriented to person, place, and time.  Psychiatric:        Mood and Affect: Mood is anxious.        Behavior: Behavior normal.     BP 124/86   Pulse 98   Temp 98.4 F (36.9 C) (Oral)   Ht 5\' 4"  (1.626 m)   Wt 116 lb 1.3 oz (52.7 kg)   SpO2 97%   BMI 19.93 kg/m  Wt Readings from Last 3 Encounters:  04/27/20 116 lb 1.3 oz (52.7 kg) (26 %, Z= -0.65)*  04/05/20 115 lb (52.2 kg) (24 %, Z= -0.71)*  01/21/20 112 lb (50.8 kg) (19 %, Z= -0.88)*   * Growth percentiles are based on CDC (Girls, 2-20 Years) data.    There are no preventive care reminders to display for this patient.  There are no preventive care reminders to display for this patient.   Lab Results  Component Value Date   TSH 2.46 12/04/2016   Lab Results  Component Value Date   WBC 5.1 03/08/2017   HGB 12.7 03/08/2017   HCT 36.9 03/08/2017   MCV 85.6 03/08/2017   PLT 271 03/08/2017   Lab Results  Component Value Date   NA 137 03/08/2017   K 4.7 03/08/2017   CO2 23 03/08/2017   GLUCOSE 82 03/08/2017   BUN 10 03/08/2017   CREATININE 0.75 03/08/2017   BILITOT 0.5 03/08/2017   ALKPHOS 76 12/04/2016   AST 20 03/08/2017   ALT 9 03/08/2017   PROT 6.7 03/08/2017   ALBUMIN 4.2 12/04/2016   CALCIUM 9.2 03/08/2017   Lab Results  Component Value Date   CHOL 155 12/04/2016    Lab Results  Component Value Date   HDL 56 12/04/2016   No results found for: [facility] Lab Results  Component Value Date   TRIG 97 (H) 12/04/2016   Lab Results  Component Value Date   CHOLHDL 2.8 12/04/2016   Lab Results  Component Value Date   HGBA1C 5.3 12/04/2016       Assessment & Plan:   Problem List Items Addressed This Visit      Genitourinary   Acute cystitis with hematuria   Relevant Medications   ciprofloxacin (CIPRO) 500 MG tablet   Other Relevant Orders   Urine Culture     Other   Continuous LLQ abdominal pain - Primary    Diffuse lower abdominal pain with significant tenderness noted in the left lower quadrant and suprapubic region.  At this time etiology of her significant symptoms is unclear. Concerns today include the possibility of ectopic pregnancy vs. complicated UTI vs. STI.  Prescription for 3 days of Tramadol provided for severe pain.  UA in office today is positive for the presents of leukocytes and trace amounts of lysed blood. Will send urine for culture for further evaluation. Will plan to begin treatment today with ciprofloxacin x 3 days for UTI.  Wet prep also performed today for evaluation due to pelvic pain and changes in vaginal discharge.  HCG quantitative blood testing ordered to rule out pregnancy. Transvaginal and abdominal ultrasound ordered to evaluate for possible ectopic pregnancy or other etiology for symptoms.  Urine GC/Chlamydia performed today to rule out STI.  Discussed emergency symptoms with patient that would warrant immediate evaluation in the emergency room such as worsening/increasing pain, fevers, inability to hold down liquids, dizziness, or feelings of impending doom.  Will make changes to the plan of care based on results of laboratory and  imaging evaluation.  Discussed contraceptive management with patient and mechanism of action of Plan B as emergency contraception as well as common side effects.  Plan to follow-up  with patient when lab results return. Patient encouraged to contact me with any questions or concerns in the meantime.        Relevant Medications   traMADol (ULTRAM) 50 MG tablet   Other Relevant Orders   WET PREP FOR TRICH, YEAST, CLUE   B-HCG Quant   US Pelvis Complete   US Pelvic Complete With Transvaginal    Other Visit Diagnoses    Abdominal pain, unspecified abdominal location       Relevant Orders   C. trachomatis/N. gonorrhoeae RNA   Urine Culture       Meds ordered this encounter  Medications  . ciprofloxacin (CIPRO) 500 MG tablet    Sig: Take 1 tablet (500 mg total) by mouth daily with breakfast.    Dispense:  3 tablet    Refill:  0  . traMADol (ULTRAM) 50 MG tablet    Sig: Take 1 tablet (50 mg total) by mouth every 8 (eight) hours as needed for up to 3 days for moderate pain or severe pain.    Dispense:  9 tablet    Refill:  0     Tollie EthSara E. Hebert Dooling, NP

## 2020-04-28 ENCOUNTER — Ambulatory Visit (INDEPENDENT_AMBULATORY_CARE_PROVIDER_SITE_OTHER): Payer: BC Managed Care – PPO

## 2020-04-28 ENCOUNTER — Other Ambulatory Visit: Payer: Self-pay | Admitting: Nurse Practitioner

## 2020-04-28 DIAGNOSIS — A749 Chlamydial infection, unspecified: Secondary | ICD-10-CM

## 2020-04-28 DIAGNOSIS — R1032 Left lower quadrant pain: Secondary | ICD-10-CM | POA: Diagnosis not present

## 2020-04-28 LAB — C. TRACHOMATIS/N. GONORRHOEAE RNA
C. trachomatis RNA, TMA: DETECTED — AB
N. gonorrhoeae RNA, TMA: NOT DETECTED

## 2020-04-28 MED ORDER — DOXYCYCLINE HYCLATE 100 MG PO TABS: 100.0000 mg | ORAL_TABLET | Freq: Two times a day (BID) | ORAL | 0 refills | Status: DC

## 2020-04-28 NOTE — Progress Notes (Signed)
Sonya Villegas,   Some of your tests have resulted. I don't have the results of the ultrasound yet, but will update you when these come in. .  Your tests for bacterial vaginosis, yeast, trichomoniasis, and gonorrhea were negative.   Unfortunately, your test for Chlamydia came back positive.  I have sent in 7 days of Doxycycline to the pharmacy to treat this. Please be sure that you take the medication every day 12 hours apart and don't skip any doses. When you are finished with the medication, we will need to test your urine again to make sure the infection is cleared. Please call and schedule that visit so we can make sure you are seen.   Your partner will also need to be treated for chlamydia infection.  He can contact his provider and tell them that you tested positive and he will need treatment or he can contact the health department and receive treatment through them.   It is very important that you both are treated for this infection to prevent re-infection. Do not have any type of sex (oral, anal, or vaginal) until you have been re-tested to make sure you have cleared the infection (until after you have come back and had the second test).   Please also keep taking the antibiotic for the urinary infection. Unfortunately, the two antibiotics work differently and will not help the other infection.   I recommend you take a probiotic and take the antibiotics with food to help prevent stomach upset. You can also take imodium if you develop diarrhea.   This is very likely a big cause for your abdominal pain- women can develop something called pelvic inflammatory disease when they get an STD and it can cause infection in the uterus. This treatment should clear this up.   Let me know if you have any questions.  SaraBeth

## 2020-04-29 ENCOUNTER — Other Ambulatory Visit: Payer: Self-pay | Admitting: Nurse Practitioner

## 2020-04-29 ENCOUNTER — Telehealth: Payer: Self-pay

## 2020-04-29 DIAGNOSIS — R112 Nausea with vomiting, unspecified: Secondary | ICD-10-CM

## 2020-04-29 LAB — URINE CULTURE
MICRO NUMBER:: 11149420
SPECIMEN QUALITY:: ADEQUATE

## 2020-04-29 LAB — WET PREP FOR TRICH, YEAST, CLUE
MICRO NUMBER:: 11148800
Specimen Quality: ADEQUATE

## 2020-04-29 LAB — HCG, QUANTITATIVE, PREGNANCY

## 2020-04-29 MED ORDER — ONDANSETRON HCL 8 MG PO TABS: ORAL_TABLET | ORAL | 2 refills | Status: DC

## 2020-04-29 NOTE — Telephone Encounter (Signed)
Patient called stating she took her first dose of her Doxycycline and threw it up about 15 minutes later. She states she took this with food. She is unsure if she should try to continue with this medication or not. Please advise

## 2020-04-29 NOTE — Telephone Encounter (Signed)
This encounter was created in error - please disregard.

## 2020-04-29 NOTE — Progress Notes (Signed)
Ultrasound came back as normal- no sign of ectopic pregnancy or other issues.  There was a small amount of fluid in the pelvic region, but this is likely due to the infection that you have.   I recommend taking all of the antibiotics as prescribed and follow-up for repeat urine test when the antibiotics are finished. Remember to have your partner treated, as well, and no sexual contact until you have completed all the medication and have been retested.

## 2020-04-29 NOTE — Progress Notes (Signed)
Urine culture positive for Kelbsiella bacteria. The antibiotic prescribed is shown to cover this. Make sure you take all of the medication and do not stop Sonya Villegas or skip doses to make sure we get the infection cleared up.

## 2020-04-29 NOTE — Telephone Encounter (Signed)
Spoke with Sonya Villegas, she is sending in nausea meds to take with antibiotics. Patient is aware these are being sent in

## 2020-05-06 ENCOUNTER — Other Ambulatory Visit: Payer: Self-pay

## 2020-05-06 ENCOUNTER — Ambulatory Visit (INDEPENDENT_AMBULATORY_CARE_PROVIDER_SITE_OTHER): Payer: BC Managed Care – PPO | Admitting: Nurse Practitioner

## 2020-05-06 ENCOUNTER — Encounter: Payer: Self-pay | Admitting: Nurse Practitioner

## 2020-05-06 VITALS — BP 113/77 | HR 77 | Temp 97.6°F | Ht 64.0 in | Wt 116.1 lb

## 2020-05-06 DIAGNOSIS — A749 Chlamydial infection, unspecified: Secondary | ICD-10-CM | POA: Diagnosis not present

## 2020-05-06 NOTE — Patient Instructions (Addendum)
I just have some basic information below for you on two very common vaginal infections for women of your age.   Bacterial Vaginosis  Bacterial vaginosis is an infection of the vagina. It happens when too many normal germs (healthy bacteria) grow in the vagina. This infection puts you at risk for infections from sex (STIs). Treating this infection can lower your risk for some STIs. You should also treat this if you are pregnant. It can cause your baby to be born Hinda Lindor. Follow these instructions at home: Medicines  Take over-the-counter and prescription medicines only as told by your doctor.  Take or use your antibiotic medicine as told by your doctor. Do not stop taking or using it even if you start to feel better. General instructions  If you your sexual partner is a woman, tell her that you have this infection. She needs to get treatment if she has symptoms. If you have a female partner, he does not need to be treated.  During treatment: ? Avoid sex. ? Do not douche. ? Avoid alcohol as told. ? Avoid breastfeeding as told.  Drink enough fluid to keep your pee (urine) clear or pale yellow.  Keep your vagina and butt (rectum) clean. ? Wash the area with warm water every day. ? Wipe from front to back after you use the toilet.  Keep all follow-up visits as told by your doctor. This is important. Preventing this condition  Do not douche.  Use only warm water to wash around your vagina.  Use protection when you have sex. This includes: ? Latex condoms. ? Dental dams.  Limit how many people you have sex with. It is Desai to only have sex with the same person (be monogamous).  Get tested for STIs. Have your partner get tested.  Wear underwear that is cotton or lined with cotton.  Avoid tight pants and pantyhose. This is most important in summer.  Do not use any products that have nicotine or tobacco in them. These include cigarettes and e-cigarettes. If you need help quitting, ask  your doctor.  Do not use illegal drugs.  Limit how much alcohol you drink. Contact a doctor if:  Your symptoms do not get better, even after you are treated.  You have more discharge or pain when you pee (urinate).  You have a fever.  You have pain in your belly (abdomen).  You have pain with sex.  Your bleed from your vagina between periods. Summary  This infection happens when too many germs (bacteria) grow in the vagina.  Treating this condition can lower your risk for some infections from sex (STIs).  You should also treat this if you are pregnant. It can cause Lillianne Eick (premature) birth.  Do not stop taking or using your antibiotic medicine even if you start to feel better. This information is not intended to replace advice given to you by your health care provider. Make sure you discuss any questions you have with your health care provider. Document Revised: 05/25/2017 Document Reviewed: 02/26/2016 Elsevier Patient Education  2020 Else[employer] Inc[employer]   Vaginal Yeast Infection, Adult  Vaginal yeast infection is a condition that causes vaginal discharge as well as soreness, swelling, and redness (inflammation) of the vagina. This is a common condition. Some women get this infection frequently. What are the causes? This condition is caused by a change in the normal balance of the yeast (candida) and bacteria that live in the vagina. This change causes an overgrowth of yeast, which causes  the inflammation. What increases the risk? The condition is more likely to develop in women who:  Take antibiotic medicines.  Have diabetes.  Take birth control pills.  Are pregnant.  Douche often.  Have a weak body defense system (immune system).  Have been taking steroid medicines for a long time.  Frequently wear tight clothing. What are the signs or symptoms? Symptoms of this condition include:  White, thick, creamy vaginal discharge.  Swelling, itching, redness, and  irritation of the vagina. The lips of the vagina (vulva) may be affected as well.  Pain or a burning feeling while urinating.  Pain during sex. How is this diagnosed? This condition is diagnosed based on:  Your medical history.  A physical exam.  A pelvic exam. Your health care provider will examine a sample of your vaginal discharge under a microscope. Your health care provider may send this sample for testing to confirm the diagnosis. How is this treated? This condition is treated with medicine. Medicines may be over-the-counter or prescription. You may be told to use one or more of the following:  Medicine that is taken by mouth (orally).  Medicine that is applied as a cream (topically).  Medicine that is inserted directly into the vagina (suppository). Follow these instructions at home:  Lifestyle  Do not have sex until your health care provider approves. Tell your sex partner that you have a yeast infection. That person should go to his or her health care provider and ask if they should also be treated.  Do not wear tight clothes, such as pantyhose or tight pants.  Wear breathable cotton underwear. General instructions  Take or apply over-the-counter and prescription medicines only as told by your health care provider.  Eat more yogurt. This may help to keep your yeast infection from returning.  Do not use tampons until your health care provider approves.  Try taking a sitz bath to help with discomfort. This is a warm water bath that is taken while you are sitting down. The water should only come up to your hips and should cover your buttocks. Do this 3-4 times per day or as told by your health care provider.  Do not douche.  If you have diabetes, keep your blood sugar levels under control.  Keep all follow-up visits as told by your health care provider. This is important. Contact a health care provider if:  You have a fever.  Your symptoms go away and then  return.  Your symptoms do not get better with treatment.  Your symptoms get worse.  You have new symptoms.  You develop blisters in or around your vagina.  You have blood coming from your vagina and it is not your menstrual period.  You develop pain in your abdomen. Summary  Vaginal yeast infection is a condition that causes discharge as well as soreness, swelling, and redness (inflammation) of the vagina.  This condition is treated with medicine. Medicines may be over-the-counter or prescription.  Take or apply over-the-counter and prescription medicines only as told by your health care provider.  Do not douche. Do not have sex or use tampons until your health care provider approves.  Contact a health care provider if your symptoms do not get better with treatment or your symptoms go away and then return. This information is not intended to replace advice given to you by your health care provider. Make sure you discuss any questions you have with your health care provider. Document Revised: 01/10/2019 Document Reviewed: 10/29/2017  Elsevier Patient Education  [employer].   Chlamydia, Female  Chlamydia is a STD (sexually transmitted disease). This is an infection that spreads through sexual contact. If it is not treated, it can cause serious problems. It must be treated with antibiotic medicine. If this infection is not treated and you are pregnant or become pregnant, your baby could get it during delivery. This may cause bad health problems for the baby. Sometimes, you may not have symptoms (asymptomatic). When you have symptoms, they can include:  Burning when you pee (urinate).  Peeing often.  Fluid (discharge) coming from the vagina.  Redness, soreness, and swelling (inflammation) of the butt (rectum).  Bleeding or fluid coming from the butt.  Belly (abdominal) pain.  Pain during sex.  Bleeding between periods.  Itching, burning, or redness in the  eyes.  Fluid coming from the eyes. Follow these instructions at home: Medicines  Take over-the-counter and prescription medicines only as told by your doctor.  Take your antibiotic medicine as told by your doctor. Do not stop taking the antibiotic even if you start to feel better. Sexual activity  Tell sex partners about your infection. Sex partners are people you had oral, anal, or vaginal sex with within 60 days of when you started getting sick. They need treatment, too.  Do not have sex until: ? You and your sex partners have been treated. ? Your doctor says it is okay.  If you have a single dose treatment, wait 7 days before having sex. General instructions  It is up to you to get your test results. Ask your doctor when your results will be ready.  Get a lot of rest.  Eat healthy foods.  Drink enough fluid to keep your pee (urine) clear or pale yellow.  Keep all follow-up visits as told by your doctor. You may need tests after 3 months. Preventing chlamydia  The only way to prevent chlamydia is not to have sex. To lower your risk: ? Use latex condoms correctly. Do this every time you have sex. ? Avoid having many sex partners. ? Ask if your partner has been tested for STDs and if he or she had negative results. Contact a doctor if:  You get new symptoms.  You do not get better with treatment.  You have a fever or chills.  You have pain during sex. Get help right away if:  Your pain gets worse and does not get better with medicine.  You get flu-like symptoms, such as: ? Night sweats. ? Sore throat. ? Muscle aches.  You feel sick to your stomach (nauseous).  You throw up (vomit).  You have trouble swallowing.  You have bleeding: ? Between periods. ? After sex.  You have irregular periods.  You have belly pain that does not get better with medicine.  You have lower back pain that does not get better with medicine.  You feel weak or dizzy.  You  pass out (faint).  You are pregnant and you get symptoms of chlamydia. Summary  Chlamydia is an infection that spreads through sexual contact.  Sometimes, chlamydia can cause no symptoms (asymptomatic).  Do not have sex until your doctor says it is okay.  All sex partners will have to be treated for chlamydia. This information is not intended to replace advice given to you by your health care provider. Make sure you discuss any questions you have with your health care provider. Document Revised: 12/04/2017 Document Reviewed: 06/01/2016 Elsevier Patient Education  2020 [employer] Inc.   Gonorrhea Gonorrhea is a sexually transmitted disease (STD) that can affect both men and women. If left untreated, this infection can: Damage the female or female organs. Cause women and men to be unable to have children (be sterile). Harm a fetus if an infected woman is pregnant. It is important to get treatment for gonorrhea as soon as possible. It is also necessary for all of your sexual partners to be tested for the infection. What are the causes? This condition is caused by bacteria called Neisseria gonorrhoeae. The infection is spread from person to person through sexual contact, including oral, anal, and vaginal sex. A newborn can contract the infection from his or her mother during birth. What increases the risk? The following factors may make you more likely to develop this condition: Being a woman who is younger than 20 years of age and who is sexually active. Being a woman 43 years of age or older who has: A new sex partner. More than one sex partner. A sex partner who has an STD. Being a man who has: A new sex partner. More than one sex partner. A sex partner who has an STD. Using condoms inconsistently. Currently having, or having previously had, an STD. Exchanging sex for money or drugs. What are the signs or symptoms? Some people do not have any symptoms. If you do have symptoms,  they may be different for females and males. For females Pain in the lower abdomen. Abnormal vaginal discharge. The discharge may be cloudy, thick, or yellow-green in color. Bleeding between periods. Painful sex. Burning or itching in and around the vagina. Pain or burning when urinating. Irritation, pain, bleeding, or discharge from the rectum. This may occur if the infection was spread by anal sex. Sore throat or swollen lymph nodes in the neck. This may occur if the infection was spread by oral sex. For males Abnormal discharge from the penis. This discharge may be cloudy, thick, or yellow-green in color. Pain or burning during urination. Pain or swelling in the testicles. Irritation, pain, bleeding, or discharge from the rectum. This may occur if the infection was spread by anal sex. Sore throat, fever, or swollen lymph nodes in the neck. This may occur if the infection was spread by oral sex. How is this diagnosed? This condition is diagnosed based on: A physical exam. A sample of discharge that is examined under a microscope to look for the bacteria. The discharge may be taken from the urethra, cervix, throat, or rectum. Urine tests. Not all of test results will be available during your visit. How is this treated? This condition is treated with antibiotic medicines. It is important for treatment to begin as soon as possible. Jidenna Figgs treatment may prevent some problems from developing. Do not have sex during treatment. Avoid all types of sexual activity for 7 days after treatment is complete and until any sex partners have been treated. Follow these instructions at home: Take over-the-counter and prescription medicines only as told by your health care provider. Take your antibiotic medicine as told by your health care provider. Do not stop taking the antibiotic even if you start to feel better. Do not have sex until at least 7 days after you and your partner(s) have finished treatment  and your health care provider says it is okay. It is your responsibility to get your test results. Ask your health care provider, or the department performing the test, when your results will be ready. If you  test positive for gonorrhea, inform your recent sexual partners. This includes any oral, anal, or vaginal sex partners. They need to be checked for gonorrhea even if they do not have symptoms. They may need treatment, even if they test negative for gonorrhea. Keep all follow-up visits as told by your health care provider. This is important. How is this prevented?  Use latex condoms correctly every time you have sexual intercourse. Ask if your sexual partner has been tested for STDs and had negative results. Avoid having multiple sexual partners. Contact a health care provider if: You develop a bad reaction to the medicine you were prescribed. This may include: A rash. Nausea. Vomiting. Diarrhea. Your symptoms do not get better after a few days of taking antibiotics. Your symptoms get worse. You develop new symptoms. Your pain gets worse. You have a fever. You develop pain, itching, or discharge around the eyes. Get help right away if: You feel dizzy or faint. You have trouble breathing or have shortness of breath. You develop an irregular heartbeat. You have severe abdominal pain with or without shoulder pain. You develop any bumps or sores (lesions) on your skin. You develop warmth, redness, pain, or swelling around your joints, such as the knee. Summary Gonorrhea is an STD that can affect both men and women. This condition is caused by bacteria called Neisseria gonorrhoeae. The infection is spread from person to person, usually through sexual contact, including oral, anal, and vaginal sex. Symptoms vary between males and females. Generally, they include abnormal discharge and burning during urination. Women may also experience painful sex, itching around the vagina, and bleeding  between menstrual periods. Men may also experience swelling of the testicles. This condition is treated with antibiotic medicines. Do not have sex until at least 7 days after completing antibiotic treatment. If left untreated, gonorrhea can have serious side effects and complications. This information is not intended to replace advice given to you by your health care provider. Make sure you discuss any questions you have with your health care provider. Document Revised: 11/05/2018 Document Reviewed: 05/12/2016 Elsevier Patient Education  2020 [employer]

## 2020-05-06 NOTE — Assessment & Plan Note (Signed)
Antibiotic therapy complete and complete resolution of symptoms.  Test of cure performed today.  Discussed transmission of STI and safe sexual practices to reduce risks of STI infection. Also discussed common vaginal infections for women of child bearing age and symptoms to monitor for.  Encouraged questions and follow-up if symptoms present that may indicate vaginal infection or STI.  Encouraged patient to avoid sexual contact until test of cure results for both she and her partner.  Follow-up if symptoms return.

## 2020-05-06 NOTE — Progress Notes (Signed)
Established Patient Office Visit  Subjective:  Patient ID: Sonya Villegas, female    DOB: May 28, 2001  Age: 19 y.o. MRN: [id number]  CC:  Chief Complaint  Patient presents with  . SEXUALLY TRANSMITTED DISEASE    HPI Sonya Villegas is a very pleasant 19 year old female presenting today for follow-up from visit last week. At her last visit she was experiencing significant lower abdominal pain, more predominant on the left side that had been ongoing for about 5 days. Evaluation determined she was positive for cystitis with klebsiella pneumoniae and chlamydial infection. She was treated with a 3 day course of ciprofloxacin and 7 days of doxycycline for both infections. It was strongly recommended that her partner be treated for chlamydial infection and they abstain from any sexual encounters until repeat test of cure could be performed. She is here today for test of cure.   She reports her abdominal pain is completely gone and she is no longer having any other symptoms. She denies increased vaginal discharge, vaginal odor, urinary pain, dysuria, fevers, chills, nausea, vomiting, diarrhea, back pain, or irregular vaginal bleeding. She reports that she has not has sexual contact since diagnosis.   She reports that her boyfriend reportedly went to his provider for testing and was provided with antibiotic treatment as well. She tells me that he was having symptoms about a month prior and told her he was tested for infection, but he told her that he was never contacted so he assumed that his STD testing was negative. She reports that she will wait until he has confirmatory negative results before resuming sexual intercourse.   History reviewed. No pertinent past medical history.  History reviewed. No pertinent surgical history.  History reviewed. No pertinent family history.  Social History   Socioeconomic History  . Marital status: Single    Spouse name: Not on file  . Number of children: Not on file   . Years of education: Not on file  . Highest education level: Not on file  Occupational History  . Not on file  Tobacco Use  . Smoking status: Former Smoker    Types: E-cigarettes  . Smokeless tobacco: Never Used  . Tobacco comment: Pt vapes  Vaping Use  . Vaping Use: Every day  Substance and Sexual Activity  . Alcohol use: No    Alcohol/week: 0.0 standard drinks  . Drug use: Yes    Types: Marijuana    Comment: occassional  . Sexual activity: Not on file  Other Topics Concern  . Not on file  Social History Narrative  . Not on file   Social Determinants of Health   Financial Resource Strain:   . Difficulty of Paying Living Expenses: Not on file  Food Insecurity:   . Worried About Programme researcher, broadcasting/film/video in the Last Year: Not on file  . Ran Out of Food in the Last Year: Not on file  Transportation Needs:   . Lack of Transportation (Medical): Not on file  . Lack of Transportation (Non-Medical): Not on file  Physical Activity:   . Days of Exercise per Week: Not on file  . Minutes of Exercise per Session: Not on file  Stress:   . Feeling of Stress : Not on file  Social Connections:   . Frequency of Communication with Friends and Family: Not on file  . Frequency of Social Gatherings with Friends and Family: Not on file  . Attends Religious Services: Not on file  . Active Member  of Clubs or Organizations: Not on file  . Attends Banker Meetings: Not on file  . Marital Status: Not on file  Intimate Partner Violence:   . Fear of Current or Ex-Partner: Not on file  . Emotionally Abused: Not on file  . Physically Abused: Not on file  . Sexually Abused: Not on file    Outpatient Medications Prior to Visit  Medication Sig Dispense Refill  . busPIRone (BUSPAR) 7.5 MG tablet Take 1 tablet (7.5 mg total) by mouth daily. 30 tablet 0  . ciprofloxacin (CIPRO) 500 MG tablet Take 1 tablet (500 mg total) by mouth daily with breakfast. 3 tablet 0  . doxycycline  (VIBRA-TABS) 100 MG tablet Take 1 tablet (100 mg total) by mouth 2 (two) times daily. 14 tablet 0  . fludrocortisone (FLORINEF) 0.1 MG tablet Take 1 tablet (0.1 mg total) by mouth daily. 30 tablet 0  . norethindrone-ethinyl estradiol (JUNEL FE 1/20) 1-20 MG-MCG tablet Take 1 tablet by mouth daily. 84 tablet 4  . ondansetron (ZOFRAN) 8 MG tablet Take one tablet by mouth 30 minutes prior to the doxycycline twice a day to prevent nausea and vomiting. May take 1/2  tablet in between doses for additional nausea. 30 tablet 2   No facility-administered medications prior to visit.    Allergies  Allergen Reactions  . Penicillins Anaphylaxis  . Amoxicillin Rash    ROS Review of Systems All review of systems negative except what is listed in the HPI    Objective:    Physical Exam Vitals and nursing note reviewed.  Constitutional:      Appearance: Normal appearance. She is normal weight.  HENT:     Head: Normocephalic.  Eyes:     Extraocular Movements: Extraocular movements intact.     Conjunctiva/sclera: Conjunctivae normal.     Pupils: Pupils are equal, round, and reactive to light.  Cardiovascular:     Rate and Rhythm: Normal rate and regular rhythm.     Pulses: Normal pulses.     Heart sounds: Normal heart sounds.  Pulmonary:     Effort: Pulmonary effort is normal.     Breath sounds: Normal breath sounds.  Abdominal:     General: Abdomen is flat. Bowel sounds are normal. There is no distension.     Palpations: Abdomen is soft.     Tenderness: There is no abdominal tenderness. There is no right CVA tenderness, left CVA tenderness, guarding or rebound.  Musculoskeletal:        General: Normal range of motion.     Cervical back: Normal range of motion.     Right lower leg: No edema.     Left lower leg: No edema.  Skin:    General: Skin is warm and dry.     Capillary Refill: Capillary refill takes less than 2 seconds.  Neurological:     General: No focal deficit present.      Mental Status: She is alert and oriented to person, place, and time.  Psychiatric:        Mood and Affect: Mood normal.        Behavior: Behavior normal.        Thought Content: Thought content normal.        Judgment: Judgment normal.     BP 113/77   Pulse 77   Temp 97.6 F (36.4 C) (Oral)   Ht 5\' 4"  (1.626 m)   Wt 116 lb 1.6 oz (52.7 kg)   SpO2 100%  BMI 19.93 kg/m  Wt Readings from Last 3 Encounters:  05/06/20 116 lb 1.6 oz (52.7 kg) (26 %, Z= -0.65)*  04/27/20 116 lb 1.3 oz (52.7 kg) (26 %, Z= -0.65)*  04/05/20 115 lb (52.2 kg) (24 %, Z= -0.71)*   * Growth percentiles are based on CDC (Girls, 2-20 Years) data.     There are no preventive care reminders to display for this patient.  There are no preventive care reminders to display for this patient.  Lab Results  Component Value Date   TSH 2.46 12/04/2016   Lab Results  Component Value Date   WBC 5.1 03/08/2017   HGB 12.7 03/08/2017   HCT 36.9 03/08/2017   MCV 85.6 03/08/2017   PLT 271 03/08/2017   Lab Results  Component Value Date   NA 137 03/08/2017   K 4.7 03/08/2017   CO2 23 03/08/2017   GLUCOSE 82 03/08/2017   BUN 10 03/08/2017   CREATININE 0.75 03/08/2017   BILITOT 0.5 03/08/2017   ALKPHOS 76 12/04/2016   AST 20 03/08/2017   ALT 9 03/08/2017   PROT 6.7 03/08/2017   ALBUMIN 4.2 12/04/2016   CALCIUM 9.2 03/08/2017   Lab Results  Component Value Date   CHOL 155 12/04/2016   Lab Results  Component Value Date   HDL 56 12/04/2016   No results found for: [facility] Lab Results  Component Value Date   TRIG 97 (H) 12/04/2016   Lab Results  Component Value Date   CHOLHDL 2.8 12/04/2016   Lab Results  Component Value Date   HGBA1C 5.3 12/04/2016      Assessment & Plan:   Problem List Items Addressed This Visit      Other   Chlamydia infection - Primary    Antibiotic therapy complete and complete resolution of symptoms.  Test of cure performed today.  Discussed transmission of STI  and safe sexual practices to reduce risks of STI infection. Also discussed common vaginal infections for women of child bearing age and symptoms to monitor for.  Encouraged questions and follow-up if symptoms present that may indicate vaginal infection or STI.  Encouraged patient to avoid sexual contact until test of cure results for both she and her partner.  Follow-up if symptoms return.       Relevant Orders   C. trachomatis/N. gonorrhoeae RNA      No orders of the defined types were placed in this encounter.   Follow-up: Return if symptoms worsen or fail to improve.    Tollie Eth, NP

## 2020-05-07 LAB — C. TRACHOMATIS/N. GONORRHOEAE RNA
C. trachomatis RNA, TMA: DETECTED — AB
N. gonorrhoeae RNA, TMA: NOT DETECTED

## 2020-05-10 ENCOUNTER — Other Ambulatory Visit: Payer: Self-pay | Admitting: Nurse Practitioner

## 2020-05-10 DIAGNOSIS — A749 Chlamydial infection, unspecified: Secondary | ICD-10-CM

## 2020-05-10 MED ORDER — DOXYCYCLINE HYCLATE 100 MG PO TABS: 100.0000 mg | ORAL_TABLET | Freq: Two times a day (BID) | ORAL | 0 refills | Status: DC

## 2020-05-10 NOTE — Progress Notes (Signed)
Chlamydia still showing positive on the urine test- let's plan to do another 7 days of doxycycline and then we can retest in 2 weeks after finishing the antibiotic to make sure this is cleared. Were you able to take the entire course of Doxycycline the first time?  Script sent to pharmacy on file.

## 2020-05-13 ENCOUNTER — Telehealth: Payer: Self-pay

## 2020-05-13 NOTE — Telephone Encounter (Signed)
Unfortunately, this is the only recommended therapy for this infection in non-pregnant women. Alternative treatments are shown to be ineffective with a high rate of antibiotic failure. If she needs something for nausea, I will be happy to send that in for her. I recommend that she take the antibiotic with food, also.

## 2020-05-13 NOTE — Telephone Encounter (Signed)
Patient advised.

## 2020-05-13 NOTE — Telephone Encounter (Signed)
Sonya Villegas states the doxycycline is causing her to feel very sick. She wanted to know if there was a different treatment for Chlamydia infection. Please advise.

## 2020-05-19 ENCOUNTER — Ambulatory Visit: Payer: BC Managed Care – PPO | Admitting: Sports Medicine

## 2020-07-06 ENCOUNTER — Ambulatory Visit: Payer: BC Managed Care – PPO | Admitting: Sports Medicine

## 2021-03-13 IMAGING — US US PELVIS COMPLETE WITH TRANSVAGINAL
1 series · 14 of 25 positions shown · non-contrast
Comparison: None

CLINICAL DATA: Initial evaluation for left lower quadrant abdominal
pain for 3 days.



[Series 1: us pelvis complete with transvaginal · 0.22mm/px · 14 of 79 slices shown]
[im 1/79]
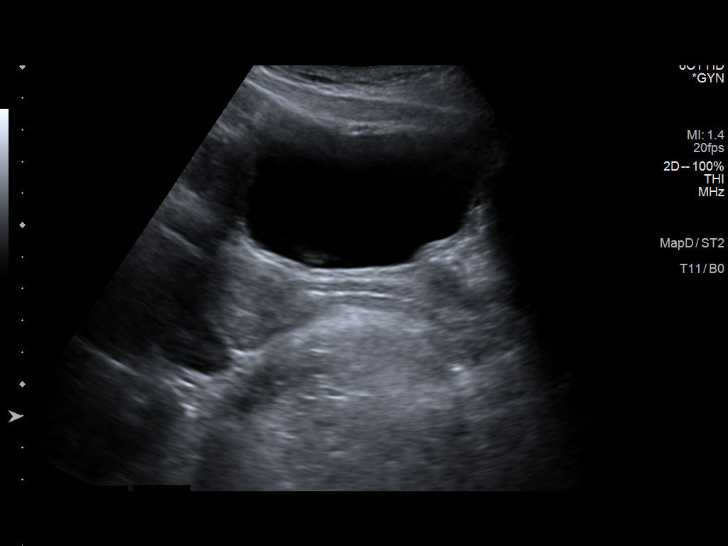
[im 7/79]
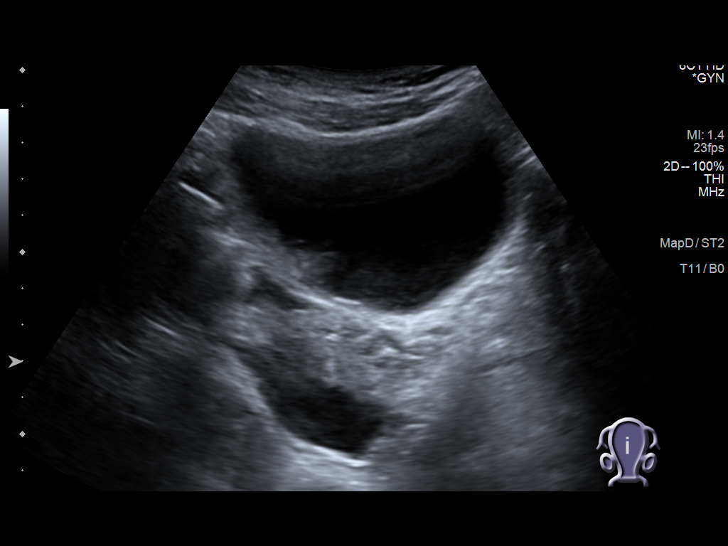
[im 14/79]
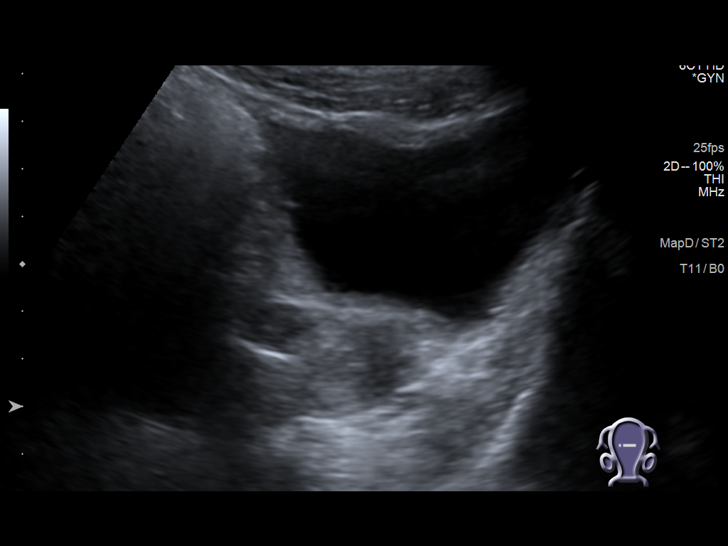
[im 20/79]
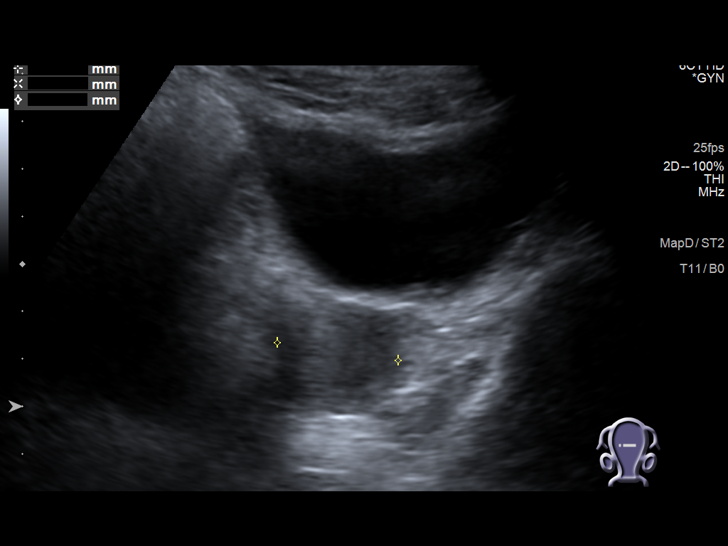
[im 27/79]
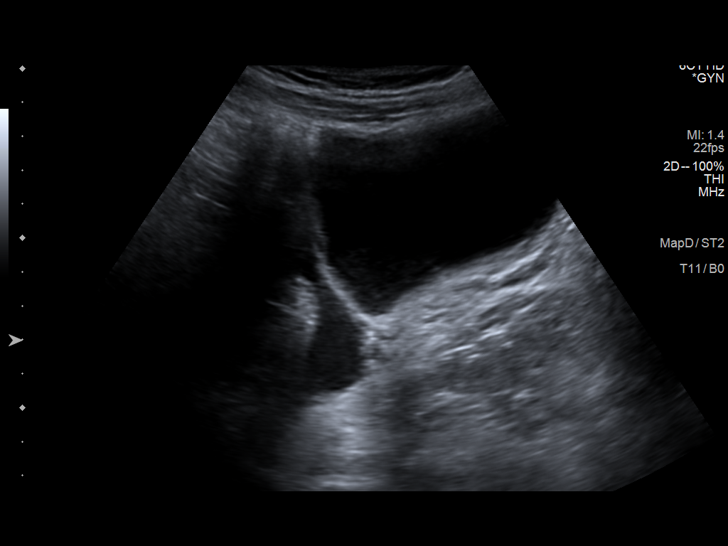
[im 30/79]
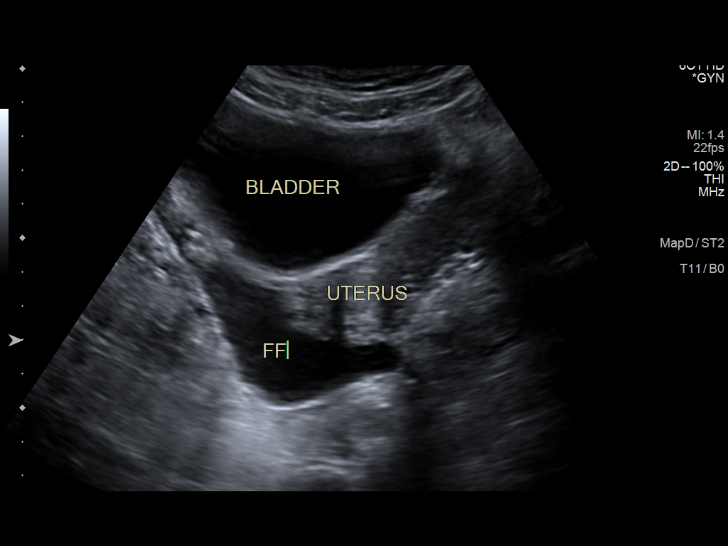
[im 36/79]
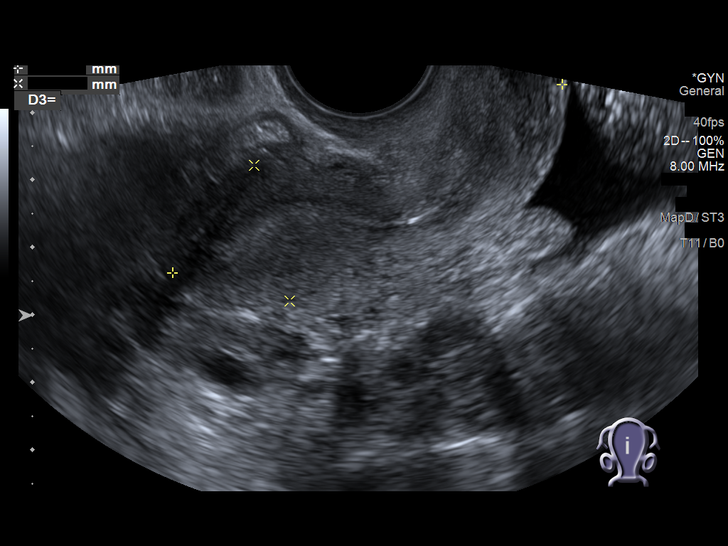
[im 43/79]
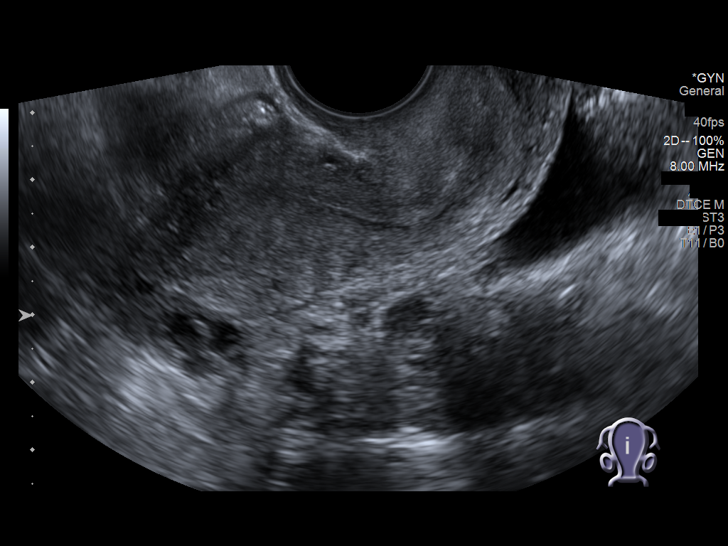
[im 49/79]
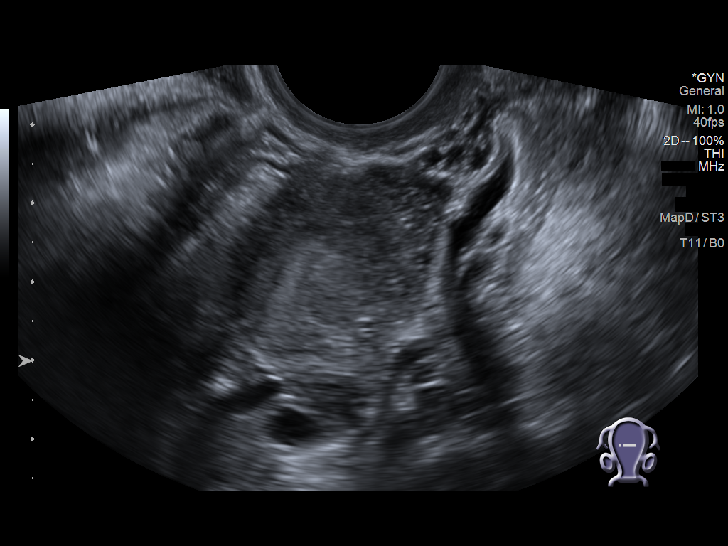
[im 53/79]
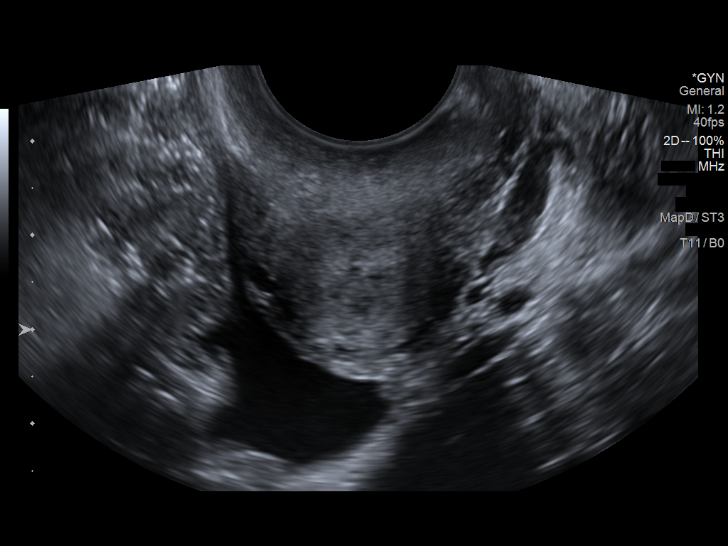
[im 59/79]
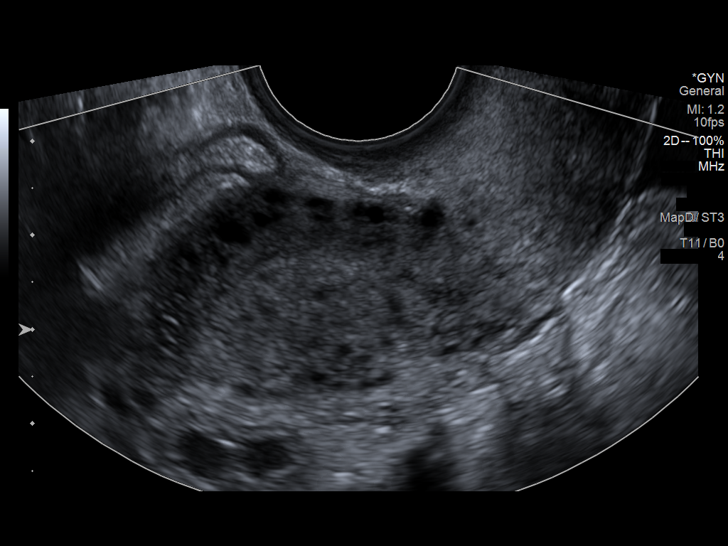
[im 66/79]
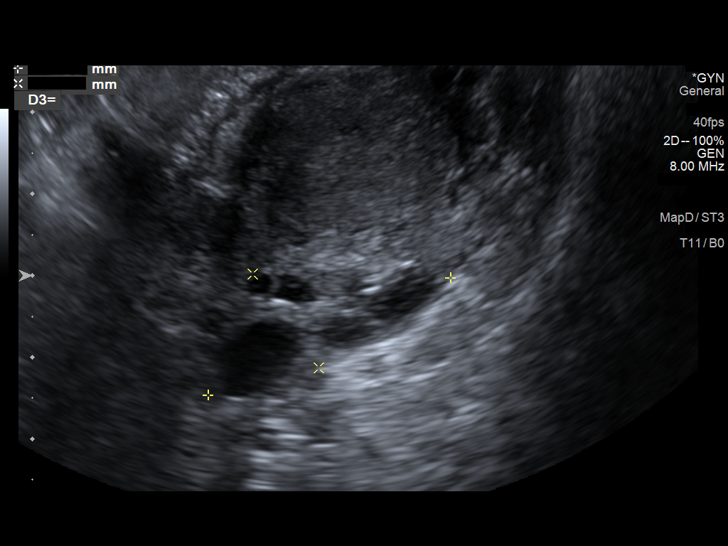
[im 72/79]
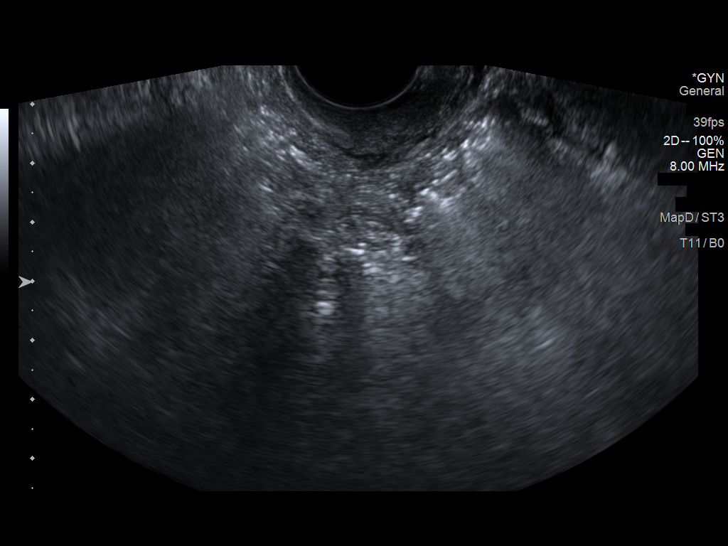
[im 79/79]
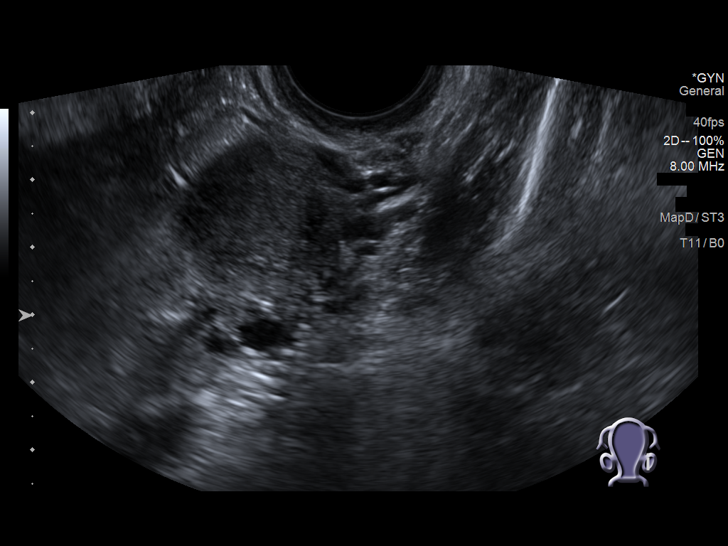

[14 of 25 positions shown; findings below may reference images not displayed]

FINDINGS: Uterus

Measurements: 7.0 x 2.3 x 2.6 cm = volume: 22 mL. No fibroids or
other mass visualized.

Endometrium

Thickness: 5.8 mm.  No focal abnormality visualized.

Right ovary

Measurements: 3.3 x 1.4 x 1.9 cm = volume: 5.0 mL. Normal
appearance/no adnexal mass.

Left ovary

Not visualized.  No adnexal mass.

Other findings

Moderate volume free fluid within the pelvis.
IMPRESSION: 1. Moderate volume free fluid within the pelvis, of uncertain
etiology, but could be physiologic.
2. Normal sonographic appearance of the uterus and right ovary.
3. Nonvisualization of the left ovary. No adnexal mass.

## 2021-03-29 ENCOUNTER — Ambulatory Visit: Payer: Self-pay | Admitting: Sports Medicine

## 2021-08-04 ENCOUNTER — Ambulatory Visit: Payer: BC Managed Care – PPO | Admitting: Sports Medicine

## 2021-08-08 ENCOUNTER — Ambulatory Visit: Payer: BC Managed Care – PPO | Admitting: Sports Medicine

## 2021-08-08 ENCOUNTER — Other Ambulatory Visit: Payer: Self-pay

## 2021-08-08 DIAGNOSIS — F319 Bipolar disorder, unspecified: Secondary | ICD-10-CM

## 2021-08-08 DIAGNOSIS — A749 Chlamydial infection, unspecified: Secondary | ICD-10-CM | POA: Diagnosis not present

## 2021-08-08 MED ORDER — BUSPIRONE HCL 7.5 MG PO TABS: 7.5000 mg | ORAL_TABLET | Freq: Every day | ORAL | 0 refills | Status: DC

## 2021-08-08 MED ORDER — LAMOTRIGINE 25 MG PO TABS: 25.0000 mg | ORAL_TABLET | Freq: Every day | ORAL | 0 refills | Status: DC

## 2021-08-08 NOTE — Assessment & Plan Note (Addendum)
History of chlamydial PID, asymptomatic now but great anxiety with regards to getting this again, she is having safe sex, we will go ahead and get a urine gonorrhea chlamydia test today.  Unfortunately the chlamydia test is positive again, we will do another course of azithromycin 1 g oral x1 with a 2-week test of cure recheck, I will order all of the above, drop by in 2 weeks for the test of cure, no appointment needed, it would just be another urine test.

## 2021-08-08 NOTE — Progress Notes (Addendum)
° ° °  Procedures performed today:    None.  Independent interpretation of notes and tests performed by another provider:   None.  Brief History, Exam, Impression, and Recommendations:    Bipolar 1 disorder (HCC) Sonya Villegas returns, I have not seen her in a while, she has known bipolar 1 disorder, historically managed by Dr. Gilmore Laroche, historically on BuSpar and Lamictal initially with good control. She has stopped these medications, we will restart, she was contracted for safety. Return to see me in a month.  Chlamydia infection History of chlamydial PID, asymptomatic now but great anxiety with regards to getting this again, she is having safe sex, we will go ahead and get a urine gonorrhea chlamydia test today.  Unfortunately the chlamydia test is positive again, we will do another course of azithromycin 1 g oral x1 with a 2-week test of cure recheck, I will order all of the above, drop by in 2 weeks for the test of cure, no appointment needed, it would just be another urine test.    ___________________________________________ Ihor Austin. Benjamin Stain, M.D., ABFM., CAQSM. Primary Care and Sports Medicine [facility]  Adjunct Instructor of Family Medicine  [employer]

## 2021-08-08 NOTE — Assessment & Plan Note (Signed)
Lunabelle returns, I have not seen her in a while, she has known bipolar 1 disorder, historically managed by Dr. Gilmore Laroche, historically on BuSpar and Lamictal initially with good control. She has stopped these medications, we will restart, she was contracted for safety. Return to see me in a month.

## 2021-08-09 LAB — C. TRACHOMATIS/N. GONORRHOEAE RNA
C. trachomatis RNA, TMA: DETECTED — AB
N. gonorrhoeae RNA, TMA: NOT DETECTED

## 2021-08-09 MED ORDER — AZITHROMYCIN 500 MG PO TABS: ORAL_TABLET | ORAL | 0 refills | Status: DC

## 2021-08-09 NOTE — Addendum Note (Signed)
Addended by: Silverio Decamp on: 08/09/2021 01:57 PM   Modules accepted: Orders

## 2021-08-10 ENCOUNTER — Telehealth: Payer: Self-pay

## 2021-08-10 ENCOUNTER — Telehealth: Payer: Self-pay | Admitting: Sports Medicine

## 2021-08-10 NOTE — Telephone Encounter (Signed)
Patient called to request some nausea meds. She stated that the last time she had to take the antibiotics she has been prescribed, they made her sick.

## 2021-08-10 NOTE — Telephone Encounter (Signed)
Pt called concerned about her chlamydia diagnoses and the fact that it went undiagnosed for so long. She wants to know should she go to an OB/GYN? I scheduled her for an appt on Tuesday but wants to know if she should come in earlier.

## 2021-08-10 NOTE — Telephone Encounter (Signed)
No I do not think we need an OB/GYN here, but it sounds like after the initial treatment she never had an actual negative test result.  This means either the initial antibiotic did not work or she contracted it again.  Should avoid all intercourse after we treat it this time, and then we can do the test of cure in 2 weeks.  If the test of cure is still abnormal and she has been celibate then we would try a different antibiotic.

## 2021-08-11 MED ORDER — ONDANSETRON 8 MG PO TBDP: 8.0000 mg | ORAL_TABLET | Freq: Three times a day (TID) | ORAL | 3 refills | Status: DC | PRN

## 2021-08-11 NOTE — Telephone Encounter (Signed)
No problem, take it after food and adding Zofran.

## 2021-08-11 NOTE — Addendum Note (Signed)
Addended by: Monica Becton on: 08/11/2021 09:06 AM   Modules accepted: Orders

## 2021-08-11 NOTE — Telephone Encounter (Signed)
Attempted to reach patient via phone; VM full.

## 2021-08-14 ENCOUNTER — Encounter: Payer: Self-pay | Admitting: Sports Medicine

## 2021-08-16 ENCOUNTER — Other Ambulatory Visit: Payer: Self-pay

## 2021-08-16 ENCOUNTER — Ambulatory Visit: Payer: BC Managed Care – PPO | Admitting: Sports Medicine

## 2021-08-16 DIAGNOSIS — A749 Chlamydial infection, unspecified: Secondary | ICD-10-CM

## 2021-08-16 DIAGNOSIS — F411 Generalized anxiety disorder: Secondary | ICD-10-CM | POA: Diagnosis not present

## 2021-08-16 DIAGNOSIS — F319 Bipolar disorder, unspecified: Secondary | ICD-10-CM

## 2021-08-16 LAB — POCT URINALYSIS DIP (CLINITEK)
Bilirubin, UA: NEGATIVE
Glucose, UA: NEGATIVE mg/dL
Ketones, POC UA: NEGATIVE mg/dL
Leukocytes, UA: NEGATIVE
Nitrite, UA: NEGATIVE
POC PROTEIN,UA: NEGATIVE
Spec Grav, UA: 1.02 (ref 1.010–1.025)
Urobilinogen, UA: 0.2 E.U./dL
pH, UA: 6.5 (ref 5.0–8.0)

## 2021-08-16 MED ORDER — ALPRAZOLAM 0.5 MG PO TABS: 0.5000 mg | ORAL_TABLET | Freq: Two times a day (BID) | ORAL | 0 refills | Status: AC | PRN

## 2021-08-16 NOTE — Addendum Note (Signed)
Addended by: Gaynelle Arabian on: 08/16/2021 03:24 PM   Modules accepted: Orders

## 2021-08-16 NOTE — Assessment & Plan Note (Signed)
Sonya Villegas, she was diagnosed again with chlamydia at the last visit, we treated her with azithromycin which she has finished, she has no symptoms with the exception of minimal suprapubic pain. We will do another regular urinalysis looking for UTI, she still has to do her test of cure in a week. She has been celibate since, I have encouraged her to let her partner or partners know to get tested and treated. Considering pelvic pain, and history of PID we will get a pelvic and transvaginal ultrasound followed by CT if abnormal.

## 2021-08-16 NOTE — Assessment & Plan Note (Signed)
Currently on BuSpar and Lamictal. Has only been on this for about a week now. She does have some panic attacks so we will add low-dose Xanax.

## 2021-08-16 NOTE — Progress Notes (Signed)
° ° °  Procedures performed today:    None.  Independent interpretation of notes and tests performed by another provider:   None.  Brief History, Exam, Impression, and Recommendations:    Chlamydia infection Oval returns, she was diagnosed again with chlamydia at the last visit, we treated her with azithromycin which she has finished, she has no symptoms with the exception of minimal suprapubic pain. We will do another regular urinalysis looking for UTI, she still has to do her test of cure in a week. She has been celibate since, I have encouraged her to let her partner or partners know to get tested and treated. Considering pelvic pain, and history of PID we will get a pelvic and transvaginal ultrasound followed by CT if abnormal.  Bipolar 1 disorder (HCC) Currently on BuSpar and Lamictal. Has only been on this for about a week now. She does have some panic attacks so we will add low-dose Xanax.  Chronic process with exacerbation and pharmacologic invention  ___________________________________________ Ihor Austin. Benjamin Stain, M.D., ABFM., CAQSM. Primary Care and Sports Medicine [facility]  Adjunct Instructor of Family Medicine  [employer]

## 2021-08-18 ENCOUNTER — Other Ambulatory Visit: Payer: Self-pay

## 2021-08-18 ENCOUNTER — Encounter: Payer: Self-pay | Admitting: Sports Medicine

## 2021-08-18 ENCOUNTER — Ambulatory Visit (INDEPENDENT_AMBULATORY_CARE_PROVIDER_SITE_OTHER): Payer: BC Managed Care – PPO

## 2021-08-18 DIAGNOSIS — A749 Chlamydial infection, unspecified: Secondary | ICD-10-CM

## 2021-08-26 LAB — C. TRACHOMATIS/N. GONORRHOEAE RNA
C. trachomatis RNA, TMA: NOT DETECTED
N. gonorrhoeae RNA, TMA: NOT DETECTED

## 2021-09-05 ENCOUNTER — Ambulatory Visit: Payer: BC Managed Care – PPO | Admitting: Sports Medicine

## 2021-09-06 ENCOUNTER — Encounter: Payer: Self-pay | Admitting: Sports Medicine

## 2021-09-06 DIAGNOSIS — F319 Bipolar disorder, unspecified: Secondary | ICD-10-CM

## 2021-09-09 MED ORDER — LAMOTRIGINE 100 MG PO TABS: 100.0000 mg | ORAL_TABLET | Freq: Every day | ORAL | 11 refills | Status: DC

## 2021-09-09 MED ORDER — BUSPIRONE HCL 10 MG PO TABS: 10.0000 mg | ORAL_TABLET | Freq: Two times a day (BID) | ORAL | 11 refills | Status: DC

## 2021-09-09 NOTE — Addendum Note (Signed)
Addended by: Silverio Decamp on: 09/09/2021 12:38 PM ? ? Modules accepted: Orders ? ?

## 2021-09-09 NOTE — Assessment & Plan Note (Signed)
Increasing BuSpar and Lamictal. ?Revisit in 4 to 6 weeks. ?

## 2021-09-26 ENCOUNTER — Ambulatory Visit: Payer: BC Managed Care – PPO | Admitting: Medical-Surgical

## 2021-09-26 ENCOUNTER — Encounter: Payer: Self-pay | Admitting: Medical-Surgical

## 2021-09-26 VITALS — BP 113/79 | HR 73 | Resp 20 | Ht 64.0 in | Wt 132.3 lb

## 2021-09-26 DIAGNOSIS — Z30017 Encounter for initial prescription of implantable subdermal contraceptive: Secondary | ICD-10-CM

## 2021-09-26 LAB — POCT URINE PREGNANCY: Preg Test, Ur: NEGATIVE

## 2021-09-26 NOTE — Progress Notes (Signed)
?  HPI with pertinent ROS:  ? ?CC: Nexplanon placement ? ?HPI: ?Pleasant 21 year old female presenting today for placement of Nexplanon. She is not currently taking any birth control. She is sexually active with female partners and after recent chlamydia infection, reports that she has insisted on condom use with all sexual interactions. Her last menstrual period started on 09/14/2021. She has had a Nexplanon before and is aware of the procedure and post-insertion process. With her previous Nexplanon, she did not have a period for about a year.  ? ?I reviewed the past medical history, family history, social history, surgical history, and allergies today and no changes were needed.  Please see the problem list section below in epic for further details. ? ? ?Physical exam:  ? ?General: Well Developed, well nourished, and in no acute distress.  ?Neuro: Alert and oriented x3.  ?HEENT: Normocephalic, atraumatic.  ?Skin: Warm and dry. ?Cardiac: Regular rate and rhythm, no murmurs rubs or gallops, no lower extremity edema.  ?Respiratory: Clear to auscultation bilaterally. Not using accessory muscles, speaking in full sentences. ? ?Impression and Recommendations:   ? ?1. Nexplanon insertion ?Patient given informed consent, signed copy in the chart, time out was performed. Pregnancy test was Negative. ?Appropriate time out taken.  Patient's left arm was prepped and draped in the usual sterile fashion.  Insertion site was approximated about 4 cm proximal to the elbow.  Pt was prepped with alcohol swab and then injected with 3cc of 1% lidocaine without epinephrine.  Pt was prepped with chlorhexidine, Nexplanon removed form packaging,  Device confirmed in needle, then inserted full length of needle and withdrawn per handbook instructions.  Pt insertion site covered with steri-strip and pressure dressing.   Minimal blood loss.  Pt tolerated the procedure well. Patient and provider palpated device under the skin and verified  successful placement. Monitor site for signs of infection. Ok to remove pressure dressing after 24 hours.  ? ?Return in about 4 weeks (around 10/24/2021) for Nexplanon check (inserted on 09/26/21). ?___________________________________________ ?Thayer Ohm, DNP, APRN, FNP-BC ?Primary Care and Sports Medicine ?[facility] ?

## 2021-10-12 ENCOUNTER — Encounter: Payer: Self-pay | Admitting: Sports Medicine

## 2021-10-12 ENCOUNTER — Ambulatory Visit: Payer: BC Managed Care – PPO | Admitting: Sports Medicine

## 2021-10-12 DIAGNOSIS — F319 Bipolar disorder, unspecified: Secondary | ICD-10-CM

## 2021-10-12 MED ORDER — SERTRALINE HCL 25 MG PO TABS: 25.0000 mg | ORAL_TABLET | Freq: Every day | ORAL | 2 refills | Status: AC

## 2021-10-12 MED ORDER — LAMOTRIGINE 100 MG PO TABS: 100.0000 mg | ORAL_TABLET | Freq: Every day | ORAL | 11 refills | Status: AC

## 2021-10-12 MED ORDER — BUSPIRONE HCL 10 MG PO TABS: 10.0000 mg | ORAL_TABLET | Freq: Two times a day (BID) | ORAL | 11 refills | Status: AC

## 2021-10-12 NOTE — Assessment & Plan Note (Signed)
I called and spoke to the patient on the phone, we did have to cancel her appointment this morning due to a technical issue with the EMR. ?She historically did well with BuSpar and Lamictal, unfortunately started having worsening depression, no obvious suicidal or homicidal ideation, she is agreeable to add on an antidepressant which is uncommon and bipolar disorder. ?Adding Zoloft 25 mg with a 6-week follow-up for dose titration. ?We can certainly keep newer medications such as Vraylar and Latuda in mind, which can be used to treat both manic and depressive phases simultaneously. ?

## 2021-10-20 ENCOUNTER — Ambulatory Visit: Payer: BC Managed Care – PPO | Admitting: Sports Medicine

## 2021-11-30 ENCOUNTER — Ambulatory Visit: Payer: BC Managed Care – PPO | Admitting: Sports Medicine

## 2021-12-22 ENCOUNTER — Encounter: Payer: Self-pay | Admitting: Sports Medicine

## 2021-12-22 ENCOUNTER — Ambulatory Visit: Payer: BC Managed Care – PPO | Admitting: Sports Medicine

## 2024-02-26 ENCOUNTER — Encounter: Payer: Self-pay | Admitting: Sports Medicine
